# Patient Record
Sex: Female | Born: 1950 | Race: White | Hispanic: No | Smoking: Former smoker
Health system: Southern US, Community
[De-identification: ages and names within clinical notes are randomized; demographics above are authoritative.]

## PROBLEM LIST (undated history)

## (undated) DIAGNOSIS — Z9889 Other specified postprocedural states: Secondary | ICD-10-CM

## (undated) DIAGNOSIS — F32A Depression, unspecified: Secondary | ICD-10-CM

## (undated) DIAGNOSIS — K219 Gastro-esophageal reflux disease without esophagitis: Secondary | ICD-10-CM

## (undated) DIAGNOSIS — G8929 Other chronic pain: Secondary | ICD-10-CM

## (undated) DIAGNOSIS — F329 Major depressive disorder, single episode, unspecified: Secondary | ICD-10-CM

## (undated) DIAGNOSIS — M549 Dorsalgia, unspecified: Secondary | ICD-10-CM

## (undated) DIAGNOSIS — F419 Anxiety disorder, unspecified: Secondary | ICD-10-CM

## (undated) DIAGNOSIS — R112 Nausea with vomiting, unspecified: Secondary | ICD-10-CM

## (undated) DIAGNOSIS — C801 Malignant (primary) neoplasm, unspecified: Secondary | ICD-10-CM

## (undated) DIAGNOSIS — E7800 Pure hypercholesterolemia, unspecified: Secondary | ICD-10-CM

## (undated) DIAGNOSIS — E039 Hypothyroidism, unspecified: Secondary | ICD-10-CM

## (undated) DIAGNOSIS — M1990 Unspecified osteoarthritis, unspecified site: Secondary | ICD-10-CM

## (undated) HISTORY — PX: CHOLECYSTECTOMY: SHX55

## (undated) HISTORY — PX: ABDOMINAL HYSTERECTOMY: SHX81

## (undated) HISTORY — DX: Major depressive disorder, single episode, unspecified: F32.9

## (undated) HISTORY — DX: Dorsalgia, unspecified: M54.9

## (undated) HISTORY — DX: Other chronic pain: G89.29

## (undated) HISTORY — DX: Depression, unspecified: F32.A

## (undated) HISTORY — DX: Hypothyroidism, unspecified: E03.9

## (undated) HISTORY — DX: Pure hypercholesterolemia, unspecified: E78.00

## (undated) HISTORY — DX: Gastro-esophageal reflux disease without esophagitis: K21.9

---

## 2016-05-25 HISTORY — PX: LAPAROSCOPIC CHOLECYSTECTOMY: SUR755

## 2016-08-07 ENCOUNTER — Encounter: Payer: Self-pay | Admitting: Cardiology

## 2016-08-07 ENCOUNTER — Ambulatory Visit (INDEPENDENT_AMBULATORY_CARE_PROVIDER_SITE_OTHER): Payer: Medicare Other | Admitting: Cardiology

## 2016-08-07 VITALS — BP 123/70 | HR 74 | Ht 65.5 in | Wt 175.4 lb

## 2016-08-07 DIAGNOSIS — E782 Mixed hyperlipidemia: Secondary | ICD-10-CM | POA: Diagnosis not present

## 2016-08-07 DIAGNOSIS — F17201 Nicotine dependence, unspecified, in remission: Secondary | ICD-10-CM | POA: Diagnosis not present

## 2016-08-07 DIAGNOSIS — R0602 Shortness of breath: Secondary | ICD-10-CM | POA: Diagnosis not present

## 2016-08-07 DIAGNOSIS — K219 Gastro-esophageal reflux disease without esophagitis: Secondary | ICD-10-CM | POA: Diagnosis not present

## 2016-08-07 DIAGNOSIS — I2510 Atherosclerotic heart disease of native coronary artery without angina pectoris: Secondary | ICD-10-CM

## 2016-08-07 NOTE — Patient Instructions (Signed)
Medication Instructions:  Your physician recommends that you continue on your current medications as directed. Please refer to the Current Medication list given to you today.  Labwork: NONE  Testing/Procedures: Your physician has requested that you have en exercise stress myoview. For further information please visit [email or web address]. Please follow instruction sheet, as given.  Follow-Up: Your physician recommends that you schedule a follow-up appointment PENDING TEST RESULTS  Any Other Special Instructions Will Be Listed Below (If Applicable).  If you need a refill on your cardiac medications before your next appointment, please call your pharmacy. 

## 2016-08-07 NOTE — Progress Notes (Signed)
Cardiology Office Note  Date: 08/07/2016   ID: [name], DOB 04/17/1951, MRN [id number]  PCP: Glenda Chroman, MD  Consulting Cardiologist: Rozann Lesches, MD   Chief Complaint  Patient presents with  . Shortness of Breath    History of Present Illness: Katherine Pope is a 66 y.o. female referred for cardiology consultation by Dr. Woody Seller for evaluation of shortness of breath. I reviewed her records. She was hospitalized at [facility] in February with acute cholecystitis. She underwent laparoscopic cholecystectomy at that time, was incidentally noted to have bilateral pleural effusions by chest imaging, right greater than left. These were described as relatively small. CT imaging incidentally noted multivessel coronary artery calcifications. She recovered from surgery and was also treated with a limited course of Lasix, reportedly with improvement in her pleural effusions by subsequent chest x-ray per Dr. Woody Seller. She also had an echocardiogram done at [facility] as outlined below showing normal LVEF.  She presents today stating that she is back to baseline, does not feel any particular shortness of breath with activity, no chest pain or palpitations. She took Lasix for a total of about 2 weeks. She is no longer on diuretic therapy. I did talk with her about the CT findings indicating coronary artery calcifications. She has not undergone any prior ischemic testing. Family history includes premature heart disease in her mother. She has a remote history of tobacco use, also hyperlipidemia. I reviewed her ECG from February.  Past Medical History:  Diagnosis Date  . Chronic back pain   . Depression   . Hypercholesteremia   . Hypothyroidism     Past Surgical History:  Procedure Laterality Date  . ABDOMINAL HYSTERECTOMY    . LAPAROSCOPIC CHOLECYSTECTOMY  05/2016    Current Outpatient Prescriptions  Medication Sig Dispense Refill  . levothyroxine (SYNTHROID,  LEVOTHROID) 75 MCG tablet Take 75 mcg by mouth daily before breakfast.    . Omega-3 Fatty Acids (FISH OIL) 1000 MG CAPS Take by mouth.    . pantoprazole (PROTONIX) 40 MG tablet Take 40 mg by mouth daily.     . Red Yeast Rice 600 MG CAPS Take by mouth.    . sertraline (ZOLOFT) 100 MG tablet Take 100 mg by mouth daily.     No current facility-administered medications for this visit.    Allergies:  Morphine and related   Social History: The patient  reports that she quit smoking about 20 years ago. Her smoking use included Cigarettes. She has never used smokeless tobacco. She reports that she does not drink alcohol or use drugs.   Family History: The patient's family history includes COPD in her mother; Lung cancer in her father.   ROS:  Please see the history of present illness. Otherwise, complete review of systems is positive for reflux symptoms, improved on Protonix.  All other systems are reviewed and negative.   Physical Exam: VS:  BP 123/70 (BP Location: Right Arm, Cuff Size: Normal)   Pulse 74   Ht 5' 5.5" (1.664 m)   Wt 175 lb 6.4 oz (79.6 kg)   SpO2 93%   BMI 28.74 kg/m , BMI Body mass index is 28.74 kg/m.  Wt Readings from Last 3 Encounters:  08/07/16 175 lb 6.4 oz (79.6 kg)    General: Overweight woman, appears comfortable at rest. HEENT: Conjunctiva and lids normal, oropharynx clear. Neck: Supple, no elevated JVP or carotid bruits, no thyromegaly. Lungs: Clear to auscultation, nonlabored breathing at rest.  Cardiac: Regular rate and rhythm, no S3 or significant systolic murmur, no pericardial rub. Abdomen: Soft, nontender, bowel sounds present, no guarding or rebound. Extremities: No pitting edema, distal pulses 2+. Skin: Warm and dry. Musculoskeletal: No kyphosis. Neuropsychiatric: Alert and oriented x3, affect grossly appropriate.  ECG: I personally reviewed the tracing from 06/15/2016 which showed normal sinus rhythm with low voltage.  Recent Labwork:  February  2018: BUN 13, creatinine 0.51, potassium 3.6, hemoglobin 10.9, platelets 2:15, troponin T less than 0.013  Other Studies Reviewed Today:  Echocardiogram 07/19/2016 [facility]): Normal LV wall thickness with LVEF 60-65%, on graded diastolic dysfunction, normal left atrial chamber size, mild mitral regurgitation, mild tricuspid regurgitation, estimated RVSP 20-30 mmHg, trace posterior pericardial effusion.  Assessment and Plan:  1. History of shortness of breath, presently resolved. She did have small pleural effusions at the time of acute cholecystitis, most likely inflammatory as her subsequent echocardiogram showed normal LVEF.  2. Incidentally noted multivessel coronary artery calcifications by chest CT imaging in February at time of cholecystitis. She does not report any frank angina symptoms. ECG reviewed and nonspecific with low voltage. Plan is to obtain a exercise Myoview for formal ischemic evaluation. If low risk would anticipate risk factor modification strategies.  3. History of hyperlipidemia, currently on red yeast rice and omega-3 supplements.  4. Tobacco abuse in remission.  5. Reflux, much improved since initiating Protonix.  Current medicines were reviewed with the patient today.  Disposition: Call with test results.  Signed, Satira Sark, MD, [facility] 08/07/2016 1:40 PM    [facility] [place], [place], [place] [address] Phone: [phone]; Fax: [phone]

## 2016-08-10 ENCOUNTER — Other Ambulatory Visit (HOSPITAL_COMMUNITY): Payer: Medicare Other

## 2016-08-10 ENCOUNTER — Encounter (HOSPITAL_COMMUNITY): Payer: Medicare Other

## 2016-08-16 ENCOUNTER — Encounter (HOSPITAL_COMMUNITY)
Admission: RE | Admit: 2016-08-16 | Discharge: 2016-08-16 | Disposition: A | Discharge status: Home / Self Care | Payer: Medicare Other | Source: Ambulatory Visit | Attending: Cardiology | Admitting: Cardiology

## 2016-08-16 ENCOUNTER — Encounter (HOSPITAL_COMMUNITY): Payer: Self-pay

## 2016-08-16 ENCOUNTER — Ambulatory Visit (HOSPITAL_COMMUNITY)
Admission: RE | Admit: 2016-08-16 | Discharge: 2016-08-16 | Disposition: A | Discharge status: Home / Self Care | Payer: Medicare Other | Source: Ambulatory Visit | Attending: Cardiology | Admitting: Cardiology

## 2016-08-16 DIAGNOSIS — I2510 Atherosclerotic heart disease of native coronary artery without angina pectoris: Secondary | ICD-10-CM | POA: Diagnosis not present

## 2016-08-16 LAB — NM MYOCAR MULTI W/SPECT W/WALL MOTION / EF
CHL CUP MPHR: 153 {beats}/min
CHL CUP NUCLEAR SDS: 8
CHL CUP RESTING HR STRESS: 64 {beats}/min
CSEPEDS: 14 s
CSEPHR: 82 %
CSEPPHR: 126 {beats}/min
Estimated workload: 7 METS
Exercise duration (min): 4 min
LHR: 0.42
LVDIAVOL: 53 mL (ref 46–106)
LVSYSVOL: 20 mL
RPE: 13
SRS: 1
SSS: 9
TID: 1.15

## 2016-08-16 MED ORDER — REGADENOSON 0.4 MG/5ML IV SOLN
INTRAVENOUS | Status: AC
  Administered 2016-08-16: 0.4 mg via INTRAVENOUS
  Filled 2016-08-16: qty 5

## 2016-08-16 MED ORDER — SODIUM CHLORIDE 0.9% FLUSH
INTRAVENOUS | Status: AC
  Administered 2016-08-16: 10 mL via INTRAVENOUS
  Filled 2016-08-16: qty 10

## 2016-08-16 MED ORDER — TECHNETIUM TC 99M TETROFOSMIN IV KIT
10.0000 | PACK | Freq: Once | INTRAVENOUS | Status: AC | PRN
  Administered 2016-08-16: 10 via INTRAVENOUS

## 2016-08-16 MED ORDER — TECHNETIUM TC 99M TETROFOSMIN IV KIT
30.0000 | PACK | Freq: Once | INTRAVENOUS | Status: AC | PRN
  Administered 2016-08-16: 30 via INTRAVENOUS

## 2016-08-17 ENCOUNTER — Telehealth: Payer: Self-pay

## 2016-08-17 NOTE — Telephone Encounter (Signed)
-----   Message from Satira Sark, MD sent at 08/17/2016  8:20 AM EDT ----- Results reviewed. Please let her know that stress testing was reassuring, low risk without evidence of obstructive CAD. Recommend that she continue to follow with Dr. Woody Seller for risk factor modification. We can see her back as needed. A copy of this test should be forwarded to Glenda Chroman, MD.

## 2016-08-18 NOTE — Telephone Encounter (Signed)
-----   Message from Satira Sark, MD sent at 08/17/2016  8:20 AM EDT ----- Results reviewed. Please let her know that stress testing was reassuring, low risk without evidence of obstructive CAD. Recommend that she continue to follow with Dr. Woody Seller for risk factor modification. We can see her back as needed. A copy of this test should be forwarded to Glenda Chroman, MD.

## 2016-08-18 NOTE — Telephone Encounter (Signed)
Patient notified. Routed to PCP 

## 2016-12-06 ENCOUNTER — Encounter: Payer: Self-pay | Admitting: Cardiology

## 2016-12-06 ENCOUNTER — Ambulatory Visit (INDEPENDENT_AMBULATORY_CARE_PROVIDER_SITE_OTHER): Payer: Medicare Other | Admitting: Cardiology

## 2016-12-06 VITALS — BP 122/74 | HR 67 | Ht 65.5 in | Wt 172.0 lb

## 2016-12-06 DIAGNOSIS — E782 Mixed hyperlipidemia: Secondary | ICD-10-CM

## 2016-12-06 DIAGNOSIS — I2510 Atherosclerotic heart disease of native coronary artery without angina pectoris: Secondary | ICD-10-CM

## 2016-12-06 NOTE — Patient Instructions (Signed)

## 2016-12-06 NOTE — Progress Notes (Signed)
Cardiology Office Note  Date: 12/06/2016   ID: Katherine Pope, DOB 02-Mar-1951, MRN [id number]  PCP: Glenda Chroman, MD  Primary Cardiologist: Rozann Lesches, MD   Chief Complaint  Patient presents with  . Cardiac follow-up    History of Present Illness: Katherine Pope is a 67 y.o. female that I met in consultation back in April of this year. She presents for a follow-up visit. Interval records indicate hospitalization at [facility] in July with epigastric discomfort. She ruled out for ACS and was started on omeprazole for possibility of GI etiology. She states that she has had no further symptoms since that time, feels like the PPI may be helping.  Exercise Myoview from April of this year was low risk as outlined below. We went over these results today as well. She does have a history of coronary calcifications based on chest CT imaging, but her stress testing would suggest that these are nonobstructive.  I personally reviewed her ECG from 10/31/2016 obtained during hospital observation showing sinus rhythm with low voltage.  Past Medical History:  Diagnosis Date  . Chronic back pain   . Depression   . Hypercholesteremia   . Hypothyroidism     Past Surgical History:  Procedure Laterality Date  . ABDOMINAL HYSTERECTOMY    . LAPAROSCOPIC CHOLECYSTECTOMY  05/2016    Current Outpatient Prescriptions  Medication Sig Dispense Refill  . citalopram (CELEXA) 20 MG tablet Take 20 mg by mouth daily.    Marland Kitchen levothyroxine (SYNTHROID, LEVOTHROID) 75 MCG tablet Take 75 mcg by mouth daily before breakfast.    . Omega-3 Fatty Acids (FISH OIL) 1000 MG CAPS Take by mouth.    . pantoprazole (PROTONIX) 40 MG tablet Take 40 mg by mouth daily.     . Red Yeast Rice 600 MG CAPS Take by mouth.     No current facility-administered medications for this visit.    Allergies:  Morphine and related   Social History: The patient  reports that she quit smoking about 20 years ago. Her smoking  use included Cigarettes. She has never used smokeless tobacco. She reports that she does not drink alcohol or use drugs.   ROS:  Please see the history of present illness. Otherwise, complete review of systems is positive for intermittent indigestion.  All other systems are reviewed and negative.   Physical Exam: VS:  BP 122/74   Pulse 67   Ht 5' 5.5" (1.664 m)   Wt 172 lb (78 kg)   SpO2 98%   BMI 28.19 kg/m , BMI Body mass index is 28.19 kg/m.  Wt Readings from Last 3 Encounters:  12/06/16 172 lb (78 kg)  08/07/16 175 lb 6.4 oz (79.6 kg)    General: Overweight woman, appears comfortable at rest. HEENT: Conjunctiva and lids normal, oropharynx clear. Neck: Supple, no elevated JVP or carotid bruits, no thyromegaly. Lungs: Clear to auscultation, nonlabored breathing at rest. Cardiac: Regular rate and rhythm, no S3 or significant systolic murmur, no pericardial rub. Abdomen: Soft, nontender, bowel sounds present, no guarding or rebound. Extremities: No pitting edema, distal pulses 2+.  ECG: I personally reviewed the tracing from 06/15/2016 which showed normal sinus rhythm with low voltage.  Recent Labwork:  February 2018: BUN 13, creatinine 0.51, potassium 3.6, hemoglobin 10.9, platelets 2:15, troponin T less than 0.013 July 2018: BUN 18, creatinine 0.79, AST 41, ALT 29, potassium 4.5, troponin T less than 0.01, hemoglobin 11.6, platelets 193  Other Studies Reviewed Today:  Echocardiogram 07/19/2016 [facility]): Normal LV wall thickness with LVEF 60-65%, on graded diastolic dysfunction, normal left atrial chamber size, mild mitral regurgitation, mild tricuspid regurgitation, estimated RVSP 20-30 mmHg, trace posterior pericardial effusion.  Exercise Myoview 08/16/2016:  Blood pressure demonstrated a hypertensive response to exercise.  The study is normal. No ischemia or scar.  This is a low risk study.  Nuclear stress EF: 61%.  Chest x-ray 10/31/2016 ([place]): Cardiomegaly with bibasilar atelectasis.  Assessment and Plan:  1. Coronary artery calcifications by chest CT imaging in February. She had an exercise Myoview in April of this year that was negative for ischemia and has normal LVEF as well. Episode of epigastric discomfort in July may well have been gastrointestinal in etiology since she reports improvement in symptoms on Protonix. From the perspective of cardiac status would recommend risk factor modification, low dose aspirin and consideration of statin therapy for treatment of hyperlipidemia. She will keep routine follow-up with Dr. Woody Seller and we will see her back annually.  2. History of hyperlipidemia, presently on omega-3 supplements and red yeast rice. Consider transition to statin for aggressive risk factor modification. She will follow up with Dr. Woody Seller.  Current medicines were reviewed with the patient today.  Disposition: Follow-up in one year.  Signed, Satira Sark, MD, [facility] 12/06/2016 3:41 PM    [facility] [place], [place], [place] [address] Phone: [phone]; Fax: [phone]

## 2018-01-09 ENCOUNTER — Telehealth: Payer: Self-pay | Admitting: Cardiology

## 2018-01-09 NOTE — Telephone Encounter (Signed)
Numerous attempts to contact patient with recall letters. Unable to reach by telephone. with no success.  12/06/2016 3:39 PM New [10]     [System] 08/22/2017 11:02 PM Notification Sent [20]   Katherine Pope [[id number]] 12/05/2017 12:24 PM Notification Sent [20]   Katherine Pope [[id number]] 01/09/2018 12:33 PM Notification Sent

## 2018-03-25 DIAGNOSIS — N390 Urinary tract infection, site not specified: Secondary | ICD-10-CM | POA: Diagnosis not present

## 2018-03-25 DIAGNOSIS — G4700 Insomnia, unspecified: Secondary | ICD-10-CM | POA: Diagnosis not present

## 2018-03-25 DIAGNOSIS — Z299 Encounter for prophylactic measures, unspecified: Secondary | ICD-10-CM | POA: Diagnosis not present

## 2018-03-25 DIAGNOSIS — Z6829 Body mass index (BMI) 29.0-29.9, adult: Secondary | ICD-10-CM | POA: Diagnosis not present

## 2018-03-25 DIAGNOSIS — R109 Unspecified abdominal pain: Secondary | ICD-10-CM | POA: Diagnosis not present

## 2018-03-25 DIAGNOSIS — E039 Hypothyroidism, unspecified: Secondary | ICD-10-CM | POA: Diagnosis not present

## 2018-04-24 DIAGNOSIS — U071 COVID-19: Secondary | ICD-10-CM

## 2018-04-24 HISTORY — DX: COVID-19: U07.1

## 2018-04-30 DIAGNOSIS — E039 Hypothyroidism, unspecified: Secondary | ICD-10-CM | POA: Diagnosis not present

## 2018-04-30 DIAGNOSIS — Z299 Encounter for prophylactic measures, unspecified: Secondary | ICD-10-CM | POA: Diagnosis not present

## 2018-04-30 DIAGNOSIS — Z6830 Body mass index (BMI) 30.0-30.9, adult: Secondary | ICD-10-CM | POA: Diagnosis not present

## 2018-04-30 DIAGNOSIS — J069 Acute upper respiratory infection, unspecified: Secondary | ICD-10-CM | POA: Diagnosis not present

## 2018-04-30 DIAGNOSIS — R509 Fever, unspecified: Secondary | ICD-10-CM | POA: Diagnosis not present

## 2018-05-07 DIAGNOSIS — Z6829 Body mass index (BMI) 29.0-29.9, adult: Secondary | ICD-10-CM | POA: Diagnosis not present

## 2018-05-07 DIAGNOSIS — Z299 Encounter for prophylactic measures, unspecified: Secondary | ICD-10-CM | POA: Diagnosis not present

## 2018-05-07 DIAGNOSIS — Z87891 Personal history of nicotine dependence: Secondary | ICD-10-CM | POA: Diagnosis not present

## 2018-05-07 DIAGNOSIS — L299 Pruritus, unspecified: Secondary | ICD-10-CM | POA: Diagnosis not present

## 2018-05-14 DIAGNOSIS — Z1231 Encounter for screening mammogram for malignant neoplasm of breast: Secondary | ICD-10-CM | POA: Diagnosis not present

## 2018-05-29 DIAGNOSIS — R928 Other abnormal and inconclusive findings on diagnostic imaging of breast: Secondary | ICD-10-CM | POA: Diagnosis not present

## 2018-05-29 DIAGNOSIS — R922 Inconclusive mammogram: Secondary | ICD-10-CM | POA: Diagnosis not present

## 2018-05-29 DIAGNOSIS — N6001 Solitary cyst of right breast: Secondary | ICD-10-CM | POA: Diagnosis not present

## 2018-06-11 DIAGNOSIS — Z299 Encounter for prophylactic measures, unspecified: Secondary | ICD-10-CM | POA: Diagnosis not present

## 2018-06-11 DIAGNOSIS — R1110 Vomiting, unspecified: Secondary | ICD-10-CM | POA: Diagnosis not present

## 2018-06-11 DIAGNOSIS — R1013 Epigastric pain: Secondary | ICD-10-CM | POA: Diagnosis not present

## 2018-06-11 DIAGNOSIS — Z6829 Body mass index (BMI) 29.0-29.9, adult: Secondary | ICD-10-CM | POA: Diagnosis not present

## 2018-06-11 DIAGNOSIS — E039 Hypothyroidism, unspecified: Secondary | ICD-10-CM | POA: Diagnosis not present

## 2018-06-12 DIAGNOSIS — L299 Pruritus, unspecified: Secondary | ICD-10-CM | POA: Diagnosis not present

## 2018-06-12 DIAGNOSIS — D225 Melanocytic nevi of trunk: Secondary | ICD-10-CM | POA: Diagnosis not present

## 2018-06-12 DIAGNOSIS — L859 Epidermal thickening, unspecified: Secondary | ICD-10-CM | POA: Diagnosis not present

## 2018-06-12 DIAGNOSIS — R5383 Other fatigue: Secondary | ICD-10-CM | POA: Diagnosis not present

## 2018-06-12 DIAGNOSIS — R112 Nausea with vomiting, unspecified: Secondary | ICD-10-CM | POA: Diagnosis not present

## 2018-06-12 DIAGNOSIS — R12 Heartburn: Secondary | ICD-10-CM | POA: Diagnosis not present

## 2018-06-12 DIAGNOSIS — R5381 Other malaise: Secondary | ICD-10-CM | POA: Diagnosis not present

## 2018-06-12 DIAGNOSIS — D485 Neoplasm of uncertain behavior of skin: Secondary | ICD-10-CM | POA: Diagnosis not present

## 2018-06-20 DIAGNOSIS — Z2821 Immunization not carried out because of patient refusal: Secondary | ICD-10-CM | POA: Diagnosis not present

## 2018-06-20 DIAGNOSIS — Z299 Encounter for prophylactic measures, unspecified: Secondary | ICD-10-CM | POA: Diagnosis not present

## 2018-06-20 DIAGNOSIS — Z6829 Body mass index (BMI) 29.0-29.9, adult: Secondary | ICD-10-CM | POA: Diagnosis not present

## 2018-06-20 DIAGNOSIS — R945 Abnormal results of liver function studies: Secondary | ICD-10-CM | POA: Diagnosis not present

## 2018-06-20 DIAGNOSIS — E039 Hypothyroidism, unspecified: Secondary | ICD-10-CM | POA: Diagnosis not present

## 2018-06-24 DIAGNOSIS — N200 Calculus of kidney: Secondary | ICD-10-CM | POA: Diagnosis not present

## 2018-06-24 DIAGNOSIS — R945 Abnormal results of liver function studies: Secondary | ICD-10-CM | POA: Diagnosis not present

## 2018-07-01 ENCOUNTER — Encounter: Payer: Self-pay | Admitting: Internal Medicine

## 2018-09-02 ENCOUNTER — Encounter: Payer: Self-pay | Admitting: Nurse Practitioner

## 2018-09-02 ENCOUNTER — Other Ambulatory Visit: Payer: Self-pay

## 2018-09-02 ENCOUNTER — Ambulatory Visit (INDEPENDENT_AMBULATORY_CARE_PROVIDER_SITE_OTHER): Payer: Medicare HMO | Admitting: Nurse Practitioner

## 2018-09-02 DIAGNOSIS — Z0000 Encounter for general adult medical examination without abnormal findings: Secondary | ICD-10-CM | POA: Diagnosis not present

## 2018-09-02 DIAGNOSIS — R109 Unspecified abdominal pain: Secondary | ICD-10-CM

## 2018-09-02 DIAGNOSIS — Z299 Encounter for prophylactic measures, unspecified: Secondary | ICD-10-CM | POA: Diagnosis not present

## 2018-09-02 DIAGNOSIS — Z7189 Other specified counseling: Secondary | ICD-10-CM | POA: Diagnosis not present

## 2018-09-02 DIAGNOSIS — Z1339 Encounter for screening examination for other mental health and behavioral disorders: Secondary | ICD-10-CM | POA: Diagnosis not present

## 2018-09-02 DIAGNOSIS — Z1211 Encounter for screening for malignant neoplasm of colon: Secondary | ICD-10-CM | POA: Diagnosis not present

## 2018-09-02 DIAGNOSIS — Z6830 Body mass index (BMI) 30.0-30.9, adult: Secondary | ICD-10-CM | POA: Diagnosis not present

## 2018-09-02 DIAGNOSIS — Z1331 Encounter for screening for depression: Secondary | ICD-10-CM | POA: Diagnosis not present

## 2018-09-02 NOTE — Progress Notes (Signed)
Visit cancelled by patient

## 2018-09-04 DIAGNOSIS — R109 Unspecified abdominal pain: Secondary | ICD-10-CM | POA: Insufficient documentation

## 2018-10-07 DIAGNOSIS — Z79899 Other long term (current) drug therapy: Secondary | ICD-10-CM | POA: Diagnosis not present

## 2018-10-07 DIAGNOSIS — E039 Hypothyroidism, unspecified: Secondary | ICD-10-CM | POA: Diagnosis not present

## 2018-10-07 DIAGNOSIS — R5383 Other fatigue: Secondary | ICD-10-CM | POA: Diagnosis not present

## 2018-10-07 DIAGNOSIS — E7800 Pure hypercholesterolemia, unspecified: Secondary | ICD-10-CM | POA: Diagnosis not present

## 2018-11-07 ENCOUNTER — Ambulatory Visit: Payer: Medicare HMO | Admitting: Nurse Practitioner

## 2018-12-02 DIAGNOSIS — E039 Hypothyroidism, unspecified: Secondary | ICD-10-CM | POA: Diagnosis not present

## 2018-12-02 DIAGNOSIS — R109 Unspecified abdominal pain: Secondary | ICD-10-CM | POA: Diagnosis not present

## 2018-12-02 DIAGNOSIS — R350 Frequency of micturition: Secondary | ICD-10-CM | POA: Diagnosis not present

## 2018-12-02 DIAGNOSIS — Z299 Encounter for prophylactic measures, unspecified: Secondary | ICD-10-CM | POA: Diagnosis not present

## 2018-12-02 DIAGNOSIS — Z6830 Body mass index (BMI) 30.0-30.9, adult: Secondary | ICD-10-CM | POA: Diagnosis not present

## 2018-12-04 ENCOUNTER — Encounter: Payer: Self-pay | Admitting: Gastroenterology

## 2018-12-04 ENCOUNTER — Ambulatory Visit: Payer: Medicare HMO | Admitting: Gastroenterology

## 2018-12-04 ENCOUNTER — Other Ambulatory Visit: Payer: Self-pay

## 2018-12-04 ENCOUNTER — Telehealth: Payer: Self-pay | Admitting: *Deleted

## 2018-12-04 VITALS — BP 141/80 | HR 67 | Temp 97.1°F | Ht 65.5 in | Wt 178.8 lb

## 2018-12-04 DIAGNOSIS — R7989 Other specified abnormal findings of blood chemistry: Secondary | ICD-10-CM

## 2018-12-04 DIAGNOSIS — R945 Abnormal results of liver function studies: Secondary | ICD-10-CM | POA: Diagnosis not present

## 2018-12-04 DIAGNOSIS — R1010 Upper abdominal pain, unspecified: Secondary | ICD-10-CM | POA: Diagnosis not present

## 2018-12-04 MED ORDER — ONDANSETRON HCL 4 MG PO TABS: 4.0000 mg | ORAL_TABLET | Freq: Three times a day (TID) | ORAL | 1 refills | Status: DC | PRN

## 2018-12-04 NOTE — Telephone Encounter (Signed)
PA was approved via [place]. Auth# [id number] Dates 12/04/2018-01/03/2019.   MRI/MRCP scheduled for 8/14 at 3pm, arrival 2:30pm, npo 4 hrs prior.  Called patient and she is aware of appt details.

## 2018-12-04 NOTE — Progress Notes (Signed)
Primary Care Physician:  Berenice Primas Primary Gastroenterologist:  Dr. Gala Romney   Chief Complaint  Patient presents with  . epigastric pain    HPI:   Katherine Pope is a 68 y.o. female presenting today at the request of Nicanor Bake, FNP-C for epigastric pain beginning around Christmas 2019.   Cholecystectomy a few years ago (2018). Symptoms at that time were RUQ pain, N/V. Started having N/V and recurrent abdominal pain but in epigastric and LUQ around Christmas 2019. Started hurting in LUQ, associated with N/V. Abdominal aching. Chills. Started again last Friday with N/V with LUQ radiating across entire upper abdomen. N/V episodes used to happen 3 times a week and now less frequently.   Abdominal pain constant LUQ. Nothing relieves pain. Bloated with eating. Feels like applies pressure to LUQ. Early satiety. Loss of appetite. Feels nauseated. No weight loss. No dysphagia. +GERD. Taking Protonix each morning 30 minutes before breakfast. Ibuprofen prn when hurting. No rectal bleeding. Denies changes in bowel habits. No constipation or diarrhea.   Started on antibiotic for UTI empirically. Macrobid 12/02/18.   No prior colonoscopy or EGD. Declining colonoscopy.   Past Medical History:  Diagnosis Date  . Chronic back pain   . Depression   . GERD (gastroesophageal reflux disease)   . Hypercholesteremia   . Hypothyroidism     Past Surgical History:  Procedure Laterality Date  . ABDOMINAL HYSTERECTOMY    . LAPAROSCOPIC CHOLECYSTECTOMY  05/2016    Current Outpatient Medications  Medication Sig Dispense Refill  . citalopram (CELEXA) 20 MG tablet Take 20 mg by mouth daily.    Marland Kitchen levothyroxine (SYNTHROID, LEVOTHROID) 75 MCG tablet Take 75 mcg by mouth daily before breakfast.    . pantoprazole (PROTONIX) 40 MG tablet Take 40 mg by mouth daily.     . ondansetron (ZOFRAN) 4 MG tablet Take 1 tablet (4 mg total) by mouth every 8 (eight) hours as needed for nausea or vomiting. 45  tablet 1   No current facility-administered medications for this visit.     Allergies as of 12/04/2018 - Review Complete 12/04/2018  Allergen Reaction Noted  . Morphine and related  08/07/2016    Family History  Problem Relation Age of Onset  . COPD Mother   . Lung cancer Father   . Colon cancer Neg Hx   . Colon polyps Neg Hx     Social History   Socioeconomic History  . Marital status: Divorced    Spouse name: Not on file  . Number of children: Not on file  . Years of education: Not on file  . Highest education level: Not on file  Occupational History  . Not on file  Social Needs  . Financial resource strain: Not on file  . Food insecurity    Worry: Not on file    Inability: Not on file  . Transportation needs    Medical: Not on file    Non-medical: Not on file  Tobacco Use  . Smoking status: Former Smoker    Types: Cigarettes    Quit date: 04/24/1996    Years since quitting: 22.6  . Smokeless tobacco: Never Used  Substance and Sexual Activity  . Alcohol use: No  . Drug use: No  . Sexual activity: Not on file  Lifestyle  . Physical activity    Days per week: Not on file    Minutes per session: Not on file  . Stress: Not on file  Relationships  . Social  connections    Talks on phone: Not on file    Gets together: Not on file    Attends religious service: Not on file    Active member of club or organization: Not on file    Attends meetings of clubs or organizations: Not on file    Relationship status: Not on file  . Intimate partner violence    Fear of current or ex partner: Not on file    Emotionally abused: Not on file    Physically abused: Not on file    Forced sexual activity: Not on file  Other Topics Concern  . Not on file  Social History Narrative  . Not on file    Review of Systems: Gen: see HPI CV: Denies chest pain, heart palpitations, peripheral edema, syncope.  Resp: Denies shortness of breath at rest or with exertion. Denies wheezing  or cough.  GI: see HPI  GU : see HPI MS: Denies joint pain, muscle weakness, cramps, or limitation of movement.  Derm: Denies rash, itching, dry skin Psych: Denies depression, anxiety, memory loss, and confusion Heme: Denies bruising, bleeding, and enlarged lymph nodes.  Physical Exam: BP (!) 141/80   Pulse 67   Temp (!) 97.1 F (36.2 C) (Temporal)   Ht 5' 5.5" (1.664 m)   Wt 178 lb 12.8 oz (81.1 kg)   BMI 29.30 kg/m  General:   Alert and oriented. Pleasant and cooperative. Well-nourished and well-developed.  Head:  Normocephalic and atraumatic. Eyes:  Without icterus, sclera clear and conjunctiva pink.  Ears:  Normal auditory acuity. Nose:  No deformity, discharge,  or lesions. Mouth:  No deformity or lesions, oral mucosa pink.  Lungs:  Clear to auscultation bilaterally. No wheezes, rales, or rhonchi. No distress.  Heart:  S1, S2 present without murmurs appreciated.  Abdomen:  +BS, soft, TTP epigastric and non-distended. No HSM noted. No guarding or rebound. No masses appreciated.  Rectal:  Deferred  Msk:  Symmetrical without gross deformities. Normal posture. Extremities:  Without edema. Neurologic:  Alert and  oriented x4 Psych:  Alert and cooperative. Normal mood and affect.  OUTSIDE LABS 12/02/2018: Amylase 20, lipase 18, BUN 10, creatinine 0.84, Albumin 4.0, Tbili 0.8, Alk Phos 193, AST 62, ALT 193, Hgb 13.7, Hct 41.6, Platelets 224  OUTSIDE Labs Feb 2020: Alk Phos 299, AST 31, ALT 66, positive H.pylori IgG (unclear if ever empirically treated) CBC normal.  OUTSIDE COMPLETE US ABDOMEN March 2020: 17 mm CBD. Two mm right mid pole kidney stone. Normal liver. Portal vein patent. Absent gallbladder, pancreas poorly visualized.

## 2018-12-04 NOTE — Assessment & Plan Note (Signed)
Mixed pattern with elevated transaminases (ALT>AST) and elevated alk phos, normal bilirubin. No evidence for underlying fatty liver disease and denies ETOH. With imaging noting dilated CBD, concern for occult stone, sludge causing intermittent obstruction, doubt malignancy. MRCP ordered. Obtain hepatitis serologies from PCP to have on file.

## 2018-12-04 NOTE — Assessment & Plan Note (Signed)
68 year old female s/p cholecystectomy in 2018 now with recurrent abdominal pain predominantly located in LUQ/epigastric and associated elevated alk phos (as high as 299 in Feb 2020 and currently 193) and transaminases (AST 62, ALT 193), with US abdomen March 2020 noting 1.7 cm CBD, normal liver. Non-toxic appearing and afebrile. Discussed at this time we needed to have better imaging of CBD and pursue MRI/MRCP. I do note that she had a positive H.pylori serology and unclear if she has been treated; however, this would not account for abnormal LFTs. Will start with MRI/MRCP with further recommendations after results are obtained. Continue with PPI daily. I have sent in Zofran for supportive measures.

## 2018-12-04 NOTE — Patient Instructions (Signed)
We have scheduled an MRI to further look at your bile duct.   I have sent in Zofran to take every 8 hours as needed. Monitor for constipation.   Continue Protonix once each morning, 30 minutes before breakfast.  Further recommendations to follow!  It was a pleasure to see you today. I want to create trusting relationships with patients to provide genuine, compassionate, and quality care. I value your feedback. If you receive a survey regarding your visit,  I greatly appreciate you taking time to fill this out.   Annitta Needs, PhD, ANP-BC [facility]

## 2018-12-04 NOTE — Telephone Encounter (Signed)
Clinicals have been faxed.

## 2018-12-04 NOTE — Telephone Encounter (Signed)
[employer] requires PA for MRCP. Submitted via [employer]. Requires clinical review. Clinicals needs to be faxed [phone] tracking # [id number]. Will await AB OV note to fax.

## 2018-12-06 ENCOUNTER — Other Ambulatory Visit: Payer: Self-pay | Admitting: Gastroenterology

## 2018-12-06 ENCOUNTER — Other Ambulatory Visit: Payer: Self-pay

## 2018-12-06 ENCOUNTER — Ambulatory Visit (HOSPITAL_COMMUNITY)
Admission: RE | Admit: 2018-12-06 | Discharge: 2018-12-06 | Disposition: A | Discharge status: Home / Self Care | Payer: Medicare HMO | Source: Ambulatory Visit | Attending: Gastroenterology | Admitting: Gastroenterology

## 2018-12-06 DIAGNOSIS — K7689 Other specified diseases of liver: Secondary | ICD-10-CM | POA: Diagnosis not present

## 2018-12-06 DIAGNOSIS — R945 Abnormal results of liver function studies: Secondary | ICD-10-CM | POA: Diagnosis not present

## 2018-12-06 DIAGNOSIS — R1010 Upper abdominal pain, unspecified: Secondary | ICD-10-CM

## 2018-12-06 DIAGNOSIS — K8050 Calculus of bile duct without cholangitis or cholecystitis without obstruction: Secondary | ICD-10-CM | POA: Diagnosis not present

## 2018-12-06 DIAGNOSIS — R7989 Other specified abnormal findings of blood chemistry: Secondary | ICD-10-CM

## 2018-12-06 LAB — POCT I-STAT CREATININE: Creatinine, Ser: 0.8 mg/dL (ref 0.44–1.00)

## 2018-12-06 MED ORDER — GADOBUTROL 1 MMOL/ML IV SOLN
9.0000 mL | Freq: Once | INTRAVENOUS | Status: AC | PRN
  Administered 2018-12-06: 9 mL via INTRAVENOUS

## 2018-12-09 ENCOUNTER — Telehealth: Payer: Self-pay

## 2018-12-09 NOTE — Telephone Encounter (Signed)
Notes recorded by Hassan Rowan, LPN on 4/78/4128 at 2:08 AM EDT  Tammy, Neil Crouch PA and Dr. Gala Romney wants pt to have ERCP with Dr. Laural Golden.  ------   Notes recorded by Annitta Needs, NP on 12/09/2018 at 9:30 AM EDT  Reviewed MRCP. Appreciate Leslie's assistance while I was out. I have sent a message to Dr. Laural Golden regarding timing of procedure.  ------   Notes recorded by Mahala Menghini, PA-C on 12/06/2018 at 8:31 PM EDT  Covering for AB. Called patient 12/06/18 at 2027. Discussed findings with RMR as well.  Patient with CBD stones. Possible calculus in cystic duct remnant as well. Pancreas normal.  Will need ERCP with NUR next week. Please arrange.  ED precautions ie fever, jaundice, persistent pain, vomiting for which she should go to ED. Per RMR, hold off on antibiotics right now as patient currently without above symptoms.

## 2018-12-09 NOTE — Progress Notes (Signed)
Reviewed MRCP. Appreciate Leslie's assistance while I was out. I have sent a message to Dr. Laural Golden regarding timing of procedure.

## 2018-12-10 ENCOUNTER — Other Ambulatory Visit (INDEPENDENT_AMBULATORY_CARE_PROVIDER_SITE_OTHER): Payer: Self-pay | Admitting: Internal Medicine

## 2018-12-10 DIAGNOSIS — K8050 Calculus of bile duct without cholangitis or cholecystitis without obstruction: Secondary | ICD-10-CM

## 2018-12-11 DIAGNOSIS — K8050 Calculus of bile duct without cholangitis or cholecystitis without obstruction: Secondary | ICD-10-CM | POA: Insufficient documentation

## 2018-12-12 ENCOUNTER — Encounter (HOSPITAL_COMMUNITY)
Admission: RE | Admit: 2018-12-12 | Discharge: 2018-12-12 | Disposition: A | Discharge status: Home / Self Care | Payer: Medicare HMO | Source: Ambulatory Visit | Attending: Internal Medicine | Admitting: Internal Medicine

## 2018-12-12 ENCOUNTER — Encounter (HOSPITAL_COMMUNITY): Payer: Self-pay

## 2018-12-12 ENCOUNTER — Other Ambulatory Visit: Payer: Self-pay

## 2018-12-12 ENCOUNTER — Other Ambulatory Visit (HOSPITAL_COMMUNITY)
Admission: RE | Admit: 2018-12-12 | Discharge: 2018-12-12 | Disposition: A | Discharge status: Home / Self Care | Payer: Medicare HMO | Source: Ambulatory Visit | Attending: Internal Medicine | Admitting: Internal Medicine

## 2018-12-12 DIAGNOSIS — K8050 Calculus of bile duct without cholangitis or cholecystitis without obstruction: Secondary | ICD-10-CM | POA: Diagnosis not present

## 2018-12-12 DIAGNOSIS — Z20828 Contact with and (suspected) exposure to other viral communicable diseases: Secondary | ICD-10-CM | POA: Diagnosis not present

## 2018-12-12 DIAGNOSIS — Z01812 Encounter for preprocedural laboratory examination: Secondary | ICD-10-CM | POA: Insufficient documentation

## 2018-12-12 HISTORY — DX: Unspecified osteoarthritis, unspecified site: M19.90

## 2018-12-12 LAB — CBC WITH DIFFERENTIAL/PLATELET
Abs Immature Granulocytes: 0.04 10*3/uL (ref 0.00–0.07)
Basophils Absolute: 0.1 10*3/uL (ref 0.0–0.1)
Basophils Relative: 1 %
Eosinophils Absolute: 0.1 10*3/uL (ref 0.0–0.5)
Eosinophils Relative: 1 %
HCT: 45.9 % (ref 36.0–46.0)
Hemoglobin: 14.4 g/dL (ref 12.0–15.0)
Immature Granulocytes: 1 %
Lymphocytes Relative: 24 %
Lymphs Abs: 1.7 10*3/uL (ref 0.7–4.0)
MCH: 29.4 pg (ref 26.0–34.0)
MCHC: 31.4 g/dL (ref 30.0–36.0)
MCV: 93.7 fL (ref 80.0–100.0)
Monocytes Absolute: 0.3 10*3/uL (ref 0.1–1.0)
Monocytes Relative: 5 %
Neutro Abs: 4.9 10*3/uL (ref 1.7–7.7)
Neutrophils Relative %: 68 %
Platelets: 295 10*3/uL (ref 150–400)
RBC: 4.9 MIL/uL (ref 3.87–5.11)
RDW: 13.7 % (ref 11.5–15.5)
WBC: 7.2 10*3/uL (ref 4.0–10.5)
nRBC: 0 % (ref 0.0–0.2)

## 2018-12-12 LAB — COMPREHENSIVE METABOLIC PANEL
ALT: 87 U/L — ABNORMAL HIGH (ref 0–44)
AST: 27 U/L (ref 15–41)
Albumin: 4.3 g/dL (ref 3.5–5.0)
Alkaline Phosphatase: 217 U/L — ABNORMAL HIGH (ref 38–126)
Anion gap: 8 (ref 5–15)
BUN: 20 mg/dL (ref 8–23)
CO2: 21 mmol/L — ABNORMAL LOW (ref 22–32)
Calcium: 9.1 mg/dL (ref 8.9–10.3)
Chloride: 105 mmol/L (ref 98–111)
Creatinine, Ser: 0.78 mg/dL (ref 0.44–1.00)
GFR calc Af Amer: >60 mL/min (ref >60)
GFR calc non Af Amer: >60 mL/min (ref >60)
Glucose, Bld: 109 mg/dL — ABNORMAL HIGH (ref 70–99)
Potassium: 4.4 mmol/L (ref 3.5–5.1)
Sodium: 134 mmol/L — ABNORMAL LOW (ref 135–145)
Total Bilirubin: 0.6 mg/dL (ref 0.3–1.2)
Total Protein: 7.7 g/dL (ref 6.5–8.1)

## 2018-12-12 LAB — PROTIME-INR
INR: 0.9 (ref 0.8–1.2)
Prothrombin Time: 11.7 seconds (ref 11.4–15.2)

## 2018-12-12 LAB — SARS CORONAVIRUS 2 (TAT 6-24 HRS): SARS Coronavirus 2: NEGATIVE

## 2018-12-12 NOTE — Patient Instructions (Signed)
Katherine Pope  12/12/2018     @PREFPERIOPPHARMACY @   Your procedure is scheduled on  12/16/2018.  Report to [facility] at  0900  A.M.  Call this number if you have problems the morning of surgery:  [phone]   Remember:  Do not eat or drink after midnight.                         Take these medicines the morning of surgery with A SIP OF WATER  Citalopram, levothyroxine, zofran(if needed), protonix.   Do not wear jewelry, make-up or nail polish.  Do not wear lotions, powders, or perfumes, or deodorant.  Do not shave 48 hours prior to surgery.  Men may shave face and neck.  Do not bring valuables to the hospital.  [facility] is not responsible for any belongings or valuables.  Contacts, dentures or bridgework may not be worn into surgery.  Leave your suitcase in the car.  After surgery it may be brought to your room.  For patients admitted to the hospital, discharge time will be determined by your treatment team.  Patients discharged the day of surgery will not be allowed to drive home.   Name and phone number of your driver:   family Special instructions:  None  Please read over the following fact sheets that you were given. Anesthesia Post-op Instructions and Care and Recovery After Surgery       Endoscopic Retrograde Cholangiopancreatogram, Care After This sheet gives you information about how to care for yourself after your procedure. Your health care provider may also give you more specific instructions. If you have problems or questions, contact your health care provider. What can I expect after the procedure? After the procedure, it is common to have:  Soreness in your throat.  Nausea.  Bloating.  Dizziness.  Tiredness (fatigue). Follow these instructions at home:   Take over-the-counter and prescription medicines only as told by your health care provider.  Do not drive for 24 hours if you were given a medicine to help you relax (sedative)  during your procedure. Have someone stay with you for 24 hours after the procedure.  Return to your normal activities as told by your health care provider. Ask your health care provider what activities are safe for you.  Return to eating what you normally do as soon as you feel well enough or as told by your health care provider.  Keep all follow-up visits as told by your health care provider. This is important. Contact a health care provider if:  You have pain in your abdomen that does not get better with medicine.  You develop signs of infection, such as: ? Chills. ? Feeling unwell. Get help right away if:  You have difficulty swallowing.  You have worsening pain in your throat, chest, or abdomen.  You vomit bright red blood or a substance that looks like coffee grounds.  You have bloody or very black stools.  You have a fever.  You have a sudden increase in swelling (bloating) in your abdomen. Summary  After the procedure, it is common to feel tired and to have some discomfort in your throat.  Contact your health care provider if you have signs of infection-such as chills or feeling unwell-or if you have pain that does not improve with medicine.  Get help right away if you have trouble swallowing, worsening pain, bloody or black vomit, bloody or  black stools, a fever, or increased swelling in your abdomen.  Keep all follow-up visits as told by your health care provider. This is important. This information is not intended to replace advice given to you by your health care provider. Make sure you discuss any questions you have with your health care provider. Document Released: 01/29/2013 Document Revised: 03/23/2017 Document Reviewed: 02/28/2016 Elsevier Patient Education  2020 [employer] Anesthesia, Adult, Care After This sheet gives you information about how to care for yourself after your procedure. Your health care provider may also give you more specific  instructions. If you have problems or questions, contact your health care provider. What can I expect after the procedure? After the procedure, the following side effects are common:  Pain or discomfort at the IV site.  Nausea.  Vomiting.  Sore throat.  Trouble concentrating.  Feeling cold or chills.  Weak or tired.  Sleepiness and fatigue.  Soreness and body aches. These side effects can affect parts of the body that were not involved in surgery. Follow these instructions at home:  For at least 24 hours after the procedure:  Have a responsible adult stay with you. It is important to have someone help care for you until you are awake and alert.  Rest as needed.  Do not: ? Participate in activities in which you could fall or become injured. ? Drive. ? Use heavy machinery. ? Drink alcohol. ? Take sleeping pills or medicines that cause drowsiness. ? Make important decisions or sign legal documents. ? Take care of children on your own. Eating and drinking  Follow any instructions from your health care provider about eating or drinking restrictions.  When you feel hungry, start by eating small amounts of foods that are soft and easy to digest (bland), such as toast. Gradually return to your regular diet.  Drink enough fluid to keep your urine pale yellow.  If you vomit, rehydrate by drinking water, juice, or clear broth. General instructions  If you have sleep apnea, surgery and certain medicines can increase your risk for breathing problems. Follow instructions from your health care provider about wearing your sleep device: ? Anytime you are sleeping, including during daytime naps. ? While taking prescription pain medicines, sleeping medicines, or medicines that make you drowsy.  Return to your normal activities as told by your health care provider. Ask your health care provider what activities are safe for you.  Take over-the-counter and prescription medicines only  as told by your health care provider.  If you smoke, do not smoke without supervision.  Keep all follow-up visits as told by your health care provider. This is important. Contact a health care provider if:  You have nausea or vomiting that does not get better with medicine.  You cannot eat or drink without vomiting.  You have pain that does not get better with medicine.  You are unable to pass urine.  You develop a skin rash.  You have a fever.  You have redness around your IV site that gets worse. Get help right away if:  You have difficulty breathing.  You have chest pain.  You have blood in your urine or stool, or you vomit blood. Summary  After the procedure, it is common to have a sore throat or nausea. It is also common to feel tired.  Have a responsible adult stay with you for the first 24 hours after general anesthesia. It is important to have someone help care for you until you  are awake and alert.  When you feel hungry, start by eating small amounts of foods that are soft and easy to digest (bland), such as toast. Gradually return to your regular diet.  Drink enough fluid to keep your urine pale yellow.  Return to your normal activities as told by your health care provider. Ask your health care provider what activities are safe for you. This information is not intended to replace advice given to you by your health care provider. Make sure you discuss any questions you have with your health care provider. Document Released: 07/17/2000 Document Revised: 04/13/2017 Document Reviewed: 11/24/2016 Elsevier Patient Education  2020 [employer].

## 2018-12-16 ENCOUNTER — Telehealth: Payer: Self-pay | Admitting: Gastroenterology

## 2018-12-16 ENCOUNTER — Encounter: Payer: Self-pay | Admitting: Internal Medicine

## 2018-12-16 ENCOUNTER — Ambulatory Visit (HOSPITAL_COMMUNITY)
Admission: RE | Admit: 2018-12-16 | Discharge: 2018-12-16 | Disposition: A | Discharge status: Home / Self Care | Payer: Medicare HMO | Attending: Internal Medicine | Admitting: Internal Medicine

## 2018-12-16 ENCOUNTER — Ambulatory Visit (HOSPITAL_COMMUNITY): Payer: Medicare HMO

## 2018-12-16 ENCOUNTER — Ambulatory Visit (HOSPITAL_COMMUNITY): Payer: Medicare HMO | Admitting: Anesthesiology

## 2018-12-16 ENCOUNTER — Encounter (HOSPITAL_COMMUNITY)
Admission: RE | Disposition: A | Discharge status: Home / Self Care | Payer: Self-pay | Source: Home / Self Care | Attending: Internal Medicine

## 2018-12-16 ENCOUNTER — Encounter (HOSPITAL_COMMUNITY): Payer: Self-pay | Admitting: *Deleted

## 2018-12-16 ENCOUNTER — Ambulatory Visit: Payer: Medicare HMO | Admitting: Nurse Practitioner

## 2018-12-16 DIAGNOSIS — K219 Gastro-esophageal reflux disease without esophagitis: Secondary | ICD-10-CM | POA: Insufficient documentation

## 2018-12-16 DIAGNOSIS — K8051 Calculus of bile duct without cholangitis or cholecystitis with obstruction: Secondary | ICD-10-CM | POA: Diagnosis not present

## 2018-12-16 DIAGNOSIS — K8050 Calculus of bile duct without cholangitis or cholecystitis without obstruction: Secondary | ICD-10-CM | POA: Diagnosis not present

## 2018-12-16 DIAGNOSIS — Z87891 Personal history of nicotine dependence: Secondary | ICD-10-CM | POA: Diagnosis not present

## 2018-12-16 DIAGNOSIS — F329 Major depressive disorder, single episode, unspecified: Secondary | ICD-10-CM | POA: Diagnosis not present

## 2018-12-16 DIAGNOSIS — K5710 Diverticulosis of small intestine without perforation or abscess without bleeding: Secondary | ICD-10-CM | POA: Diagnosis not present

## 2018-12-16 DIAGNOSIS — Z79899 Other long term (current) drug therapy: Secondary | ICD-10-CM | POA: Diagnosis not present

## 2018-12-16 DIAGNOSIS — Z79890 Hormone replacement therapy (postmenopausal): Secondary | ICD-10-CM | POA: Diagnosis not present

## 2018-12-16 DIAGNOSIS — E039 Hypothyroidism, unspecified: Secondary | ICD-10-CM | POA: Diagnosis not present

## 2018-12-16 HISTORY — PX: SPHINCTEROTOMY: SHX5544

## 2018-12-16 HISTORY — PX: ERCP: SHX5425

## 2018-12-16 HISTORY — PX: LITHOTRIPSY: SHX5546

## 2018-12-16 HISTORY — PX: REMOVAL OF STONES: SHX5545

## 2018-12-16 SURGERY — ERCP, WITH INTERVENTION IF INDICATED
Anesthesia: General

## 2018-12-16 MED ORDER — ONDANSETRON HCL 4 MG/2ML IJ SOLN
INTRAMUSCULAR | Status: DC | PRN
  Administered 2018-12-16: 4 mg via INTRAVENOUS

## 2018-12-16 MED ORDER — FENTANYL CITRATE (PF) 100 MCG/2ML IJ SOLN
INTRAMUSCULAR | Status: DC | PRN
  Administered 2018-12-16: 100 ug via INTRAVENOUS

## 2018-12-16 MED ORDER — SUCCINYLCHOLINE CHLORIDE 200 MG/10ML IV SOSY
PREFILLED_SYRINGE | INTRAVENOUS | Status: DC | PRN
  Administered 2018-12-16: 100 mg via INTRAVENOUS

## 2018-12-16 MED ORDER — CHLORHEXIDINE GLUCONATE CLOTH 2 % EX PADS: 6.0000 | MEDICATED_PAD | Freq: Once | CUTANEOUS | Status: DC

## 2018-12-16 MED ORDER — MEPERIDINE HCL 50 MG/ML IJ SOLN: 6.2500 mg | INTRAMUSCULAR | Status: DC | PRN

## 2018-12-16 MED ORDER — ONDANSETRON HCL 4 MG/2ML IJ SOLN: 4.0000 mg | Freq: Once | INTRAMUSCULAR | Status: DC | PRN

## 2018-12-16 MED ORDER — LIDOCAINE HCL (CARDIAC) PF 100 MG/5ML IV SOSY
PREFILLED_SYRINGE | INTRAVENOUS | Status: DC | PRN
  Administered 2018-12-16: 100 mg via INTRAVENOUS

## 2018-12-16 MED ORDER — GLUCAGON HCL RDNA (DIAGNOSTIC) 1 MG IJ SOLR
INTRAMUSCULAR | Status: AC
  Filled 2018-12-16: qty 1

## 2018-12-16 MED ORDER — WATER FOR IRRIGATION, STERILE IR SOLN
Status: DC | PRN
  Administered 2018-12-16: 1000 mL

## 2018-12-16 MED ORDER — ROCURONIUM BROMIDE 100 MG/10ML IV SOLN
INTRAVENOUS | Status: DC | PRN
  Administered 2018-12-16: 50 mg via INTRAVENOUS

## 2018-12-16 MED ORDER — FENTANYL CITRATE (PF) 100 MCG/2ML IJ SOLN
25.0000 ug | INTRAMUSCULAR | Status: DC | PRN
  Administered 2018-12-16 (×2): 25 ug via INTRAVENOUS

## 2018-12-16 MED ORDER — SODIUM CHLORIDE 0.9 % IV SOLN
INTRAVENOUS | Status: DC | PRN
  Administered 2018-12-16: 12:00:00 50 mL

## 2018-12-16 MED ORDER — FENTANYL CITRATE (PF) 100 MCG/2ML IJ SOLN
INTRAMUSCULAR | Status: AC
  Filled 2018-12-16: qty 2

## 2018-12-16 MED ORDER — LIDOCAINE 2% (20 MG/ML) 5 ML SYRINGE
INTRAMUSCULAR | Status: AC
  Filled 2018-12-16: qty 5

## 2018-12-16 MED ORDER — CEFAZOLIN SODIUM-DEXTROSE 2-4 GM/100ML-% IV SOLN
INTRAVENOUS | Status: AC
  Filled 2018-12-16: qty 100

## 2018-12-16 MED ORDER — PHENYLEPHRINE HCL (PRESSORS) 10 MG/ML IV SOLN
INTRAVENOUS | Status: DC | PRN
  Administered 2018-12-16 (×3): 100 ug via INTRAVENOUS

## 2018-12-16 MED ORDER — SUGAMMADEX SODIUM 500 MG/5ML IV SOLN
INTRAVENOUS | Status: DC | PRN
  Administered 2018-12-16: 200 mg via INTRAVENOUS

## 2018-12-16 MED ORDER — SEVOFLURANE IN SOLN
RESPIRATORY_TRACT | Status: AC
  Filled 2018-12-16: qty 500

## 2018-12-16 MED ORDER — IPRATROPIUM-ALBUTEROL 0.5-2.5 (3) MG/3ML IN SOLN
RESPIRATORY_TRACT | Status: AC
  Filled 2018-12-16: qty 3

## 2018-12-16 MED ORDER — IPRATROPIUM-ALBUTEROL 0.5-2.5 (3) MG/3ML IN SOLN
3.0000 mL | Freq: Once | RESPIRATORY_TRACT | Status: AC
  Administered 2018-12-16: 3 mL via RESPIRATORY_TRACT

## 2018-12-16 MED ORDER — PROPOFOL 10 MG/ML IV BOLUS
INTRAVENOUS | Status: AC
  Filled 2018-12-16: qty 40

## 2018-12-16 MED ORDER — LACTATED RINGERS IV SOLN
Freq: Once | INTRAVENOUS | Status: AC
  Administered 2018-12-16: 10:00:00 1000 mL via INTRAVENOUS

## 2018-12-16 MED ORDER — LACTATED RINGERS IV SOLN
INTRAVENOUS | Status: DC | PRN
  Administered 2018-12-16: 11:00:00 via INTRAVENOUS

## 2018-12-16 MED ORDER — GLUCAGON HCL RDNA (DIAGNOSTIC) 1 MG IJ SOLR
INTRAMUSCULAR | Status: DC | PRN
  Administered 2018-12-16 (×2): 0.25 mg via INTRAVENOUS

## 2018-12-16 MED ORDER — GLYCOPYRROLATE PF 0.2 MG/ML IJ SOSY
PREFILLED_SYRINGE | INTRAMUSCULAR | Status: AC
  Filled 2018-12-16: qty 1

## 2018-12-16 MED ORDER — PROPOFOL 10 MG/ML IV BOLUS
INTRAVENOUS | Status: DC | PRN
  Administered 2018-12-16: 180 mg via INTRAVENOUS

## 2018-12-16 MED ORDER — SUCCINYLCHOLINE CHLORIDE 200 MG/10ML IV SOSY
PREFILLED_SYRINGE | INTRAVENOUS | Status: AC
  Filled 2018-12-16: qty 10

## 2018-12-16 MED ORDER — CEFAZOLIN SODIUM-DEXTROSE 2-4 GM/100ML-% IV SOLN
2.0000 g | INTRAVENOUS | Status: AC
  Administered 2018-12-16: 2 g via INTRAVENOUS

## 2018-12-16 MED ORDER — SODIUM CHLORIDE 0.9 % IV SOLN
INTRAVENOUS | Status: AC
  Filled 2018-12-16: qty 50

## 2018-12-16 NOTE — Anesthesia Preprocedure Evaluation (Addendum)
Anesthesia Evaluation  Patient identified by MRN, date of birth, ID band Patient awake    Reviewed: Allergy & Precautions, NPO status , Patient's Chart, lab work & pertinent test results, reviewed documented beta blocker date and time   Airway Mallampati: II  TM Distance: >3 FB     Dental  (+) Upper Dentures, Lower Dentures   Pulmonary former smoker,    Pulmonary exam normal breath sounds clear to auscultation       Cardiovascular Exercise Tolerance: Good Normal cardiovascular exam Rhythm:Regular Rate:Normal  Echocardiogram 07/19/2016 [facility]): Normal LV wall thickness with LVEF 60-65%, on graded diastolic dysfunction, normal left atrial chamber size, mild mitral regurgitation, mild tricuspid regurgitation, estimated RVSP 20-30 mmHg, trace posterior pericardial effusion.  Exercise Myoview 08/16/2016:  Blood pressure demonstrated a hypertensive response to exercise.  The study is normal. No ischemia or scar.  This is a low risk study.  Nuclear stress EF: 61%.   Neuro/Psych PSYCHIATRIC DISORDERS Depression    GI/Hepatic GERD  Controlled,High LFTs, CBD stones   Endo/Other  Hypothyroidism   Renal/GU      Musculoskeletal  (+) Arthritis ,   Abdominal   Peds  Hematology   Anesthesia Other Findings   Reproductive/Obstetrics                            Anesthesia Physical Anesthesia Plan  ASA: II  Anesthesia Plan: General   Post-op Pain Management:    Induction: Intravenous  PONV Risk Score and Plan:   Airway Management Planned: Oral ETT  Additional Equipment:   Intra-op Plan:   Post-operative Plan: Extubation in OR  Informed Consent: I have reviewed the patients History and Physical, chart, labs and discussed the procedure including the risks, benefits and alternatives for the proposed anesthesia with the patient or authorized representative who has indicated  his/her understanding and acceptance.     Dental advisory given  Plan Discussed with: CRNA and Surgeon  Anesthesia Plan Comments:        Anesthesia Quick Evaluation

## 2018-12-16 NOTE — H&P (Signed)
Katherine Pope is an 68 y.o. female.   Chief Complaint: Patient is here for ERCP with sphincterotomy and stone extraction. HPI: Patient is 68 year old Caucasian female who had laparoscopic cholecystectomy about 2-1/2 years ago.  Ever since she has had episodic epigastric pain associated nausea and vomiting.  During that evaluation she was found to have H. pylori infection and she has been treated.  Ultrasound revealed dilated bile duct measuring 17 mm.  Patient was referred to [facility] and she was seen by Ms. Roseanne Kaufman, NP.  MRCP was obtained and which revealed dilated CBD with choledocholithiasis. Patient therefore referred for therapeutic ERCP. Patient reports last episode of pain was about 3 weeks ago.  Pain may last a day or 2.  She has noted orange color to her urine when this occurs.  She has never developed jaundice.  She has good appetite and has not lost any weight.  She does not take OTC NSAIDs.  Patient does not smoke cigarettes or drink alcohol.  Past Medical History:  Diagnosis Date  . Arthritis   . Chronic back pain   . Depression   . GERD (gastroesophageal reflux disease)   . Hypercholesteremia   . Hypothyroidism     Past Surgical History:  Procedure Laterality Date  . ABDOMINAL HYSTERECTOMY    . LAPAROSCOPIC CHOLECYSTECTOMY  05/2016    Family History  Problem Relation Age of Onset  . COPD Mother   . Lung cancer Father   . Colon cancer Neg Hx   . Colon polyps Neg Hx    Social History:  reports that she quit smoking about 22 years ago. Her smoking use included cigarettes. She has a 20.00 pack-year smoking history. She has never used smokeless tobacco. She reports that she does not drink alcohol or use drugs.  Allergies:  Allergies  Allergen Reactions  . Morphine And Related     Oxygen level dropped.     Medications Prior to Admission  Medication Sig Dispense Refill  . citalopram (CELEXA) 20 MG tablet Take 20 mg by mouth daily.    Marland Kitchen  levothyroxine (SYNTHROID, LEVOTHROID) 75 MCG tablet Take 75 mcg by mouth daily before breakfast.    . ondansetron (ZOFRAN) 4 MG tablet Take 1 tablet (4 mg total) by mouth every 8 (eight) hours as needed for nausea or vomiting. 45 tablet 1  . pantoprazole (PROTONIX) 40 MG tablet Take 40 mg by mouth daily.       No results found for this or any previous visit (from the past 48 hour(s)). No results found.  ROS  Blood pressure 135/65, pulse 67, temperature 98.2 F (36.8 C), temperature source Oral, resp. rate 20, SpO2 98 %. Physical Exam  Constitutional: She appears well-developed and well-nourished.  HENT:  Mouth/Throat: Oropharynx is clear and moist.  Eyes: Conjunctivae are normal. No scleral icterus.  Neck: No thyromegaly present.  Cardiovascular: Normal rate, regular rhythm and normal heart sounds.  No murmur heard. Respiratory: Effort normal and breath sounds normal.  GI:  Small supraumbilical scar.  Abdomen is soft and nontender with organomegaly or masses.  Musculoskeletal:        General: No edema.  Lymphadenopathy:    She has no cervical adenopathy.  Neurological: She is alert.  Skin: Skin is warm and dry.    MRCP images reviewed.  She has dilated CBD and CHD and fairly long cystic duct remnant.  There is a large rectangle stone in CBD along with a smaller stone above that.  Lab data from 12/12/2018 reviewed.  Normal CBC and INR. ALT is 87 AST 27 and alkaline phosphatase 217.  Assessment/Plan Patient is 68 year old Caucasian female with recurrent biliary colic who luckily has not developed full-blown cholangitis or obstructive jaundice.   She will undergo ERCP with sphincterotomy and stone extraction.  A larger stone will have to be crushed.  Biliary stenting only if stones cannot be removed.  Hildred Laser, MD 12/16/2018, 10:57 AM

## 2018-12-16 NOTE — Anesthesia Postprocedure Evaluation (Signed)
Anesthesia Post Note  Patient: Katherine Pope  Procedure(s) Performed: ENDOSCOPIC RETROGRADE CHOLANGIOPANCREATOGRAPHY (ERCP) (N/A ) SPHINCTEROTOMY (N/A ) REMOVAL OF STONES (N/A )  Patient location during evaluation: PACU Anesthesia Type: General Level of consciousness: awake and alert and patient cooperative Pain management: pain level controlled Vital Signs Assessment: post-procedure vital signs reviewed and stable Respiratory status: spontaneous breathing, patient connected to face mask oxygen, respiratory function stable and nonlabored ventilation Cardiovascular status: stable Postop Assessment: no apparent nausea or vomiting Anesthetic complications: no     Last Vitals:  Vitals:   12/16/18 0947 12/16/18 1200  BP: 135/65 (!) 146/78  Pulse: 67 85  Resp: 20 11  Temp: 36.8 C 36.6 C  SpO2: 98% 98%    Last Pain:  Vitals:   12/16/18 0947  TempSrc: Oral  PainSc: 4                  Easton Sivertson

## 2018-12-16 NOTE — Telephone Encounter (Signed)
Stacey: please have patient return in 4 weeks for follow-up after ERCP.

## 2018-12-16 NOTE — Progress Notes (Signed)
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 Dilated CBD and CHD with large rectangle stone along with smaller stones. Biliary sphincterotomy performed. Largest stone crushed with a Dormia basket. All stones were removed with basket and stone balloon extractor. ED was not cannulated or filled with contrast Patient tolerated procedure well.

## 2018-12-16 NOTE — Anesthesia Procedure Notes (Signed)
Procedure Name: Intubation Date/Time: 12/16/2018 11:15 AM Performed by: Jonna Munro, CRNA Pre-anesthesia Checklist: Patient identified, Emergency Drugs available, Suction available, Patient being monitored and Timeout performed Patient Re-evaluated:Patient Re-evaluated prior to induction Oxygen Delivery Method: Circle system utilized Preoxygenation: Pre-oxygenation with 100% oxygen Induction Type: IV induction, Rapid sequence and Cricoid Pressure applied Laryngoscope Size: Mac and 3 Grade View: Grade I Tube type: Oral Tube size: 7.0 mm Number of attempts: 1 Airway Equipment and Method: Stylet Placement Confirmation: ETT inserted through vocal cords under direct vision,  positive ETCO2 and breath sounds checked- equal and bilateral Secured at: 22 cm Tube secured with: Tape Dental Injury: Teeth and Oropharynx as per pre-operative assessment

## 2018-12-16 NOTE — Progress Notes (Signed)
Mild audible wheezing noted. Pt denies sob. DR Gwenith Spitz aware and assessed pt, pt now receiving duo neb breathing tx. Nad. Family updated and educated on incentive spirometer as well

## 2018-12-16 NOTE — Transfer of Care (Signed)
Immediate Anesthesia Transfer of Care Note  Patient: Katherine Pope  Procedure(s) Performed: ENDOSCOPIC RETROGRADE CHOLANGIOPANCREATOGRAPHY (ERCP) (N/A ) SPHINCTEROTOMY (N/A ) REMOVAL OF STONES (N/A )  Patient Location: PACU  Anesthesia Type:General  Level of Consciousness: awake, alert , oriented and patient cooperative  Airway & Oxygen Therapy: Patient Spontanous Breathing and Patient connected to face mask oxygen  Post-op Assessment: Report given to RN and Post -op Vital signs reviewed and stable  Post vital signs: Reviewed and stable  Last Vitals:  Vitals Value Taken Time  BP    Temp    Pulse 79 12/16/18 1201  Resp 16 12/16/18 1201  SpO2 96 % 12/16/18 1201  Vitals shown include unvalidated device data.  Last Pain:  Vitals:   12/16/18 0947  TempSrc: Oral  PainSc: 4       Patients Stated Pain Goal: 8 ([id number])  Complications: No apparent anesthesia complications

## 2018-12-16 NOTE — Op Note (Signed)
[facility] Patient Name: Katherine Pope Procedure Date: 12/16/2018 9:58 AM MRN: [id number] Date of Birth: December 03, 1950 Attending MD: Hildred Laser , MD CSN: [id number] Age: 68 Admit Type: Outpatient Procedure:                ERCP Indications:              For therapy of bile duct stone(s) Providers:                Hildred Laser, MD, Otis Peak B. Sharon Seller, RN, Randa Spike, Technician Referring MD:             Roseanne Kaufman , NP and Nicanor Bake, NP Medicines:                General Anesthesia Complications:            No immediate complications. Estimated Blood Loss:     Estimated blood loss: none. Procedure:                Pre-Anesthesia Assessment:                           - Prior to the procedure, a History and Physical                            was performed, and patient medications and                            allergies were reviewed. The patient's tolerance of                            previous anesthesia was also reviewed. The risks                            and benefits of the procedure and the sedation                            options and risks were discussed with the patient.                            All questions were answered, and informed consent                            was obtained. Prior Anticoagulants: The patient has                            taken no previous anticoagulant or antiplatelet                            agents. ASA Grade Assessment: II - A patient with                            mild systemic disease. After reviewing the risks  and benefits, the patient was deemed in                            satisfactory condition to undergo the procedure.                           After obtaining informed consent, the scope was                            passed under direct vision. Throughout the                            procedure, the patient's blood pressure, pulse, and                             oxygen saturations were monitored continuously. The                            [id number]) was introduced through the                            mouth, and used to inject contrast into and used to                            inject contrast into the bile duct. The ERCP was                            accomplished without difficulty. The patient                            tolerated the procedure well. Scope In: Scope Out: Findings:      The scout film was normal. The esophagus was successfully intubated       under direct vision. The scope was advanced to a normal major papilla in       the descending duodenum without detailed examination of the pharynx,       larynx and associated structures, and upper GI tract. The upper GI tract       was grossly normal. Major pailla located in the roof of small       diverticulum. The bile duct was deeply cannulated with the Hydratome       sphincterotome. Contrast was injected. I personally interpreted the bile       duct images. There was brisk flow of contrast through the ducts. Image       quality was adequate. Contrast extended to the main bile duct. Contrast       extended to the bifurcation. The common bile duct and common hepatic       duct were diffusely dilated, with a stone causing an obstruction. The       largest diameter was 20 mm. An 8 mm biliary sphincterotomy was made with       a braided Hydratome sphincterotome using ERBE electrocautery. There was       no post-sphincterotomy bleeding. To discover objects, the biliary tree       was swept with a basket starting at the bifurcation. Many stones were  removed. No stones remained. Lithotripsy with a basket-type device was       successful. The biliary tree was swept with a basket starting at the       bifurcation. All stones were removed. Impression:               - The common bile duct and common hepatic duct were                            dilated, with at least two  stones.One stone was                            large rectangular requiring mechanical lithotripsy.                           - A biliary sphincterotomy was performed.                           - Lithotripsy was successful. Complete removal was                            accomplished by biliary sphincterotomy and basket                            extraction.                           - A biliary sphincterotomy was performed. Moderate Sedation:      Per Anesthesia Care Recommendation:           - Avoid aspirin and nonsteroidal anti-inflammatory                            medicines for 3 days.                           - Discharge patient to home (with escort).                           - Clear liquid diet today.                           - Continue present medications.                           - Return to GI clinic in 4 weeks ( [place] with M.s                            Roseanne Kaufman, NP). Procedure Code(s):        --- Professional ---                           [phone], Endoscopic retrograde                            cholangiopancreatography (ERCP); with destruction  of calculi, any method (eg, mechanical,                            electrohydraulic, lithotripsy)                           [id number], Endoscopic retrograde                            cholangiopancreatography (ERCP); with                            sphincterotomy/papillotomy Diagnosis Code(s):        --- Professional ---                           K80.51, Calculus of bile duct without cholangitis                            or cholecystitis with obstruction CPT copyright 2019 American [employer] Association. All rights reserved. The codes documented in this report are preliminary and upon coder review may  be revised to meet current compliance requirements. Hildred Laser, MD Hildred Laser, MD 12/16/2018 12:13:18 PM This report has been signed electronically. Number of Addenda: 0

## 2018-12-16 NOTE — Discharge Instructions (Signed)
No aspirin or NSAIDs for 72 hours. Resume usual medications as before. Clear liquids today and usual diet starting tomorrow morning. No driving for 24 hours. Follow-up with Ms. Roseanne Kaufman, NP in 4 weeks.  You should also have blood work prior to that visit.  Our office will contact you.   Endoscopic Retrograde Cholangiopancreatogram, Care After This sheet gives you information about how to care for yourself after your procedure. Your health care provider may also give you more specific instructions. If you have problems or questions, contact your health care provider. What can I expect after the procedure? After the procedure, it is common to have:  Soreness in your throat.  Nausea.  Bloating.  Dizziness.  Tiredness (fatigue). Follow these instructions at home:   Take over-the-counter and prescription medicines only as told by your health care provider.  Do not drive for 24 hours if you were given a medicine to help you relax (sedative) during your procedure. Have someone stay with you for 24 hours after the procedure.  Return to your normal activities as told by your health care provider. Ask your health care provider what activities are safe for you.  Return to eating what you normally do as soon as you feel well enough or as told by your health care provider.  Keep all follow-up visits as told by your health care provider. This is important. Contact a health care provider if:  You have pain in your abdomen that does not get better with medicine.  You develop signs of infection, such as: ? Chills. ? Feeling unwell. Get help right away if:  You have difficulty swallowing.  You have worsening pain in your throat, chest, or abdomen.  You vomit bright red blood or a substance that looks like coffee grounds.  You have bloody or very black stools.  You have a fever.  You have a sudden increase in swelling (bloating) in your abdomen. Summary  After the procedure, it  is common to feel tired and to have some discomfort in your throat.  Contact your health care provider if you have signs of infection--such as chills or feeling unwell--or if you have pain that does not improve with medicine.  Get help right away if you have trouble swallowing, worsening pain, bloody or black vomit, bloody or black stools, a fever, or increased swelling in your abdomen.  Keep all follow-up visits as told by your health care provider. This is important. This information is not intended to replace advice given to you by your health care provider. Make sure you discuss any questions you have with your health care provider. Document Released: 01/29/2013 Document Revised: 03/23/2017 Document Reviewed: 02/28/2016 Elsevier Patient Education  2020 [place] Anesthesia, Adult, Care After This sheet gives you information about how to care for yourself after your procedure. Your health care provider may also give you more specific instructions. If you have problems or questions, contact your health care provider. What can I expect after the procedure? After the procedure, the following side effects are common:  Pain or discomfort at the IV site.  Nausea.  Vomiting.  Sore throat.  Trouble concentrating.  Feeling cold or chills.  Weak or tired.  Sleepiness and fatigue.  Soreness and body aches. These side effects can affect parts of the body that were not involved in surgery. Follow these instructions at home:  For at least 24 hours after the procedure:  Have a responsible adult stay with you. It is important  to have someone help care for you until you are awake and alert.  Rest as needed.  Do not: ? Participate in activities in which you could fall or become injured. ? Drive. ? Use heavy machinery. ? Drink alcohol. ? Take sleeping pills or medicines that cause drowsiness. ? Make important decisions or sign legal documents. ? Take care of children on  your own. Eating and drinking  Follow any instructions from your health care provider about eating or drinking restrictions.  When you feel hungry, start by eating small amounts of foods that are soft and easy to digest (bland), such as toast. Gradually return to your regular diet.  Drink enough fluid to keep your urine pale yellow.  If you vomit, rehydrate by drinking water, juice, or clear broth. General instructions  If you have sleep apnea, surgery and certain medicines can increase your risk for breathing problems. Follow instructions from your health care provider about wearing your sleep device: ? Anytime you are sleeping, including during daytime naps. ? While taking prescription pain medicines, sleeping medicines, or medicines that make you drowsy.  Return to your normal activities as told by your health care provider. Ask your health care provider what activities are safe for you.  Take over-the-counter and prescription medicines only as told by your health care provider.  If you smoke, do not smoke without supervision.  Keep all follow-up visits as told by your health care provider. This is important. Contact a health care provider if:  You have nausea or vomiting that does not get better with medicine.  You cannot eat or drink without vomiting.  You have pain that does not get better with medicine.  You are unable to pass urine.  You develop a skin rash.  You have a fever.  You have redness around your IV site that gets worse. Get help right away if:  You have difficulty breathing.  You have chest pain.  You have blood in your urine or stool, or you vomit blood. Summary  After the procedure, it is common to have a sore throat or nausea. It is also common to feel tired.  Have a responsible adult stay with you for the first 24 hours after general anesthesia. It is important to have someone help care for you until you are awake and alert.  When you feel  hungry, start by eating small amounts of foods that are soft and easy to digest (bland), such as toast. Gradually return to your regular diet.  Drink enough fluid to keep your urine pale yellow.  Return to your normal activities as told by your health care provider. Ask your health care provider what activities are safe for you. This information is not intended to replace advice given to you by your health care provider. Make sure you discuss any questions you have with your health care provider. Document Released: 07/17/2000 Document Revised: 04/13/2017 Document Reviewed: 11/24/2016 Elsevier Patient Education  2020 [employer].

## 2018-12-16 NOTE — Telephone Encounter (Signed)
PATIENT SCHEDULED AND LETTER SENT  °

## 2018-12-20 ENCOUNTER — Encounter (HOSPITAL_COMMUNITY): Payer: Self-pay | Admitting: Internal Medicine

## 2019-01-23 ENCOUNTER — Ambulatory Visit: Payer: Medicare HMO | Admitting: Gastroenterology

## 2019-02-10 ENCOUNTER — Ambulatory Visit: Payer: Medicare HMO | Admitting: Gastroenterology

## 2019-03-18 DIAGNOSIS — F419 Anxiety disorder, unspecified: Secondary | ICD-10-CM | POA: Diagnosis not present

## 2019-03-18 DIAGNOSIS — E039 Hypothyroidism, unspecified: Secondary | ICD-10-CM | POA: Diagnosis not present

## 2019-03-18 DIAGNOSIS — Z6830 Body mass index (BMI) 30.0-30.9, adult: Secondary | ICD-10-CM | POA: Diagnosis not present

## 2019-03-18 DIAGNOSIS — Z713 Dietary counseling and surveillance: Secondary | ICD-10-CM | POA: Diagnosis not present

## 2019-03-18 DIAGNOSIS — Z299 Encounter for prophylactic measures, unspecified: Secondary | ICD-10-CM | POA: Diagnosis not present

## 2019-06-22 DIAGNOSIS — K219 Gastro-esophageal reflux disease without esophagitis: Secondary | ICD-10-CM | POA: Diagnosis not present

## 2019-06-22 DIAGNOSIS — E039 Hypothyroidism, unspecified: Secondary | ICD-10-CM | POA: Diagnosis not present

## 2019-08-12 DIAGNOSIS — E039 Hypothyroidism, unspecified: Secondary | ICD-10-CM | POA: Diagnosis not present

## 2019-08-12 DIAGNOSIS — Z299 Encounter for prophylactic measures, unspecified: Secondary | ICD-10-CM | POA: Diagnosis not present

## 2019-08-12 DIAGNOSIS — M549 Dorsalgia, unspecified: Secondary | ICD-10-CM | POA: Diagnosis not present

## 2019-08-12 DIAGNOSIS — Z87891 Personal history of nicotine dependence: Secondary | ICD-10-CM | POA: Diagnosis not present

## 2019-08-12 DIAGNOSIS — R350 Frequency of micturition: Secondary | ICD-10-CM | POA: Diagnosis not present

## 2019-08-12 DIAGNOSIS — F329 Major depressive disorder, single episode, unspecified: Secondary | ICD-10-CM | POA: Diagnosis not present

## 2019-08-27 DIAGNOSIS — J069 Acute upper respiratory infection, unspecified: Secondary | ICD-10-CM | POA: Diagnosis not present

## 2019-08-27 DIAGNOSIS — E039 Hypothyroidism, unspecified: Secondary | ICD-10-CM | POA: Diagnosis not present

## 2019-08-27 DIAGNOSIS — Z299 Encounter for prophylactic measures, unspecified: Secondary | ICD-10-CM | POA: Diagnosis not present

## 2019-08-27 DIAGNOSIS — R05 Cough: Secondary | ICD-10-CM | POA: Diagnosis not present

## 2019-09-09 DIAGNOSIS — Z79899 Other long term (current) drug therapy: Secondary | ICD-10-CM | POA: Diagnosis not present

## 2019-09-09 DIAGNOSIS — Z1339 Encounter for screening examination for other mental health and behavioral disorders: Secondary | ICD-10-CM | POA: Diagnosis not present

## 2019-09-09 DIAGNOSIS — E039 Hypothyroidism, unspecified: Secondary | ICD-10-CM | POA: Diagnosis not present

## 2019-09-09 DIAGNOSIS — Z6831 Body mass index (BMI) 31.0-31.9, adult: Secondary | ICD-10-CM | POA: Diagnosis not present

## 2019-09-09 DIAGNOSIS — Z1211 Encounter for screening for malignant neoplasm of colon: Secondary | ICD-10-CM | POA: Diagnosis not present

## 2019-09-09 DIAGNOSIS — Z299 Encounter for prophylactic measures, unspecified: Secondary | ICD-10-CM | POA: Diagnosis not present

## 2019-09-09 DIAGNOSIS — R5383 Other fatigue: Secondary | ICD-10-CM | POA: Diagnosis not present

## 2019-09-09 DIAGNOSIS — E7800 Pure hypercholesterolemia, unspecified: Secondary | ICD-10-CM | POA: Diagnosis not present

## 2019-09-09 DIAGNOSIS — Z7189 Other specified counseling: Secondary | ICD-10-CM | POA: Diagnosis not present

## 2019-09-09 DIAGNOSIS — Z1331 Encounter for screening for depression: Secondary | ICD-10-CM | POA: Diagnosis not present

## 2019-09-09 DIAGNOSIS — Z0000 Encounter for general adult medical examination without abnormal findings: Secondary | ICD-10-CM | POA: Diagnosis not present

## 2019-09-22 DIAGNOSIS — E039 Hypothyroidism, unspecified: Secondary | ICD-10-CM | POA: Diagnosis not present

## 2019-09-22 DIAGNOSIS — M1990 Unspecified osteoarthritis, unspecified site: Secondary | ICD-10-CM | POA: Diagnosis not present

## 2019-10-22 DIAGNOSIS — F329 Major depressive disorder, single episode, unspecified: Secondary | ICD-10-CM | POA: Diagnosis not present

## 2019-10-22 DIAGNOSIS — E039 Hypothyroidism, unspecified: Secondary | ICD-10-CM | POA: Diagnosis not present

## 2019-11-11 DIAGNOSIS — E7800 Pure hypercholesterolemia, unspecified: Secondary | ICD-10-CM | POA: Diagnosis not present

## 2019-11-11 DIAGNOSIS — J320 Chronic maxillary sinusitis: Secondary | ICD-10-CM | POA: Diagnosis not present

## 2019-11-11 DIAGNOSIS — Z87891 Personal history of nicotine dependence: Secondary | ICD-10-CM | POA: Diagnosis not present

## 2019-11-11 DIAGNOSIS — Z299 Encounter for prophylactic measures, unspecified: Secondary | ICD-10-CM | POA: Diagnosis not present

## 2019-11-11 DIAGNOSIS — E039 Hypothyroidism, unspecified: Secondary | ICD-10-CM | POA: Diagnosis not present

## 2019-12-03 ENCOUNTER — Inpatient Hospital Stay (HOSPITAL_COMMUNITY): Payer: Medicare HMO | Admitting: Certified Registered Nurse Anesthetist

## 2019-12-03 ENCOUNTER — Encounter (HOSPITAL_COMMUNITY): Admission: EM | Disposition: A | Discharge status: Skilled Nursing Facility | Payer: Self-pay | Source: Home / Self Care

## 2019-12-03 ENCOUNTER — Emergency Department (HOSPITAL_COMMUNITY): Payer: Medicare HMO

## 2019-12-03 ENCOUNTER — Encounter (HOSPITAL_COMMUNITY): Payer: Self-pay

## 2019-12-03 ENCOUNTER — Inpatient Hospital Stay (HOSPITAL_COMMUNITY): Payer: Medicare HMO

## 2019-12-03 ENCOUNTER — Other Ambulatory Visit: Payer: Self-pay

## 2019-12-03 ENCOUNTER — Inpatient Hospital Stay (HOSPITAL_COMMUNITY)
Admission: EM | Admit: 2019-12-03 | Discharge: 2019-12-12 | DRG: 516 | Disposition: A | Discharge status: Skilled Nursing Facility | Payer: Medicare HMO | Attending: General Surgery | Admitting: General Surgery

## 2019-12-03 DIAGNOSIS — S81002A Unspecified open wound, left knee, initial encounter: Secondary | ICD-10-CM

## 2019-12-03 DIAGNOSIS — S334XXA Traumatic rupture of symphysis pubis, initial encounter: Secondary | ICD-10-CM

## 2019-12-03 DIAGNOSIS — S329XXA Fracture of unspecified parts of lumbosacral spine and pelvis, initial encounter for closed fracture: Secondary | ICD-10-CM

## 2019-12-03 DIAGNOSIS — S3141XA Laceration without foreign body of vagina and vulva, initial encounter: Secondary | ICD-10-CM

## 2019-12-03 DIAGNOSIS — S32810B Multiple fractures of pelvis with stable disruption of pelvic ring, initial encounter for open fracture: Secondary | ICD-10-CM

## 2019-12-03 DIAGNOSIS — Z419 Encounter for procedure for purposes other than remedying health state, unspecified: Secondary | ICD-10-CM

## 2019-12-03 DIAGNOSIS — S81012A Laceration without foreign body, left knee, initial encounter: Secondary | ICD-10-CM

## 2019-12-03 DIAGNOSIS — T1490XA Injury, unspecified, initial encounter: Secondary | ICD-10-CM

## 2019-12-03 DIAGNOSIS — E039 Hypothyroidism, unspecified: Secondary | ICD-10-CM

## 2019-12-03 DIAGNOSIS — S40811A Abrasion of right upper arm, initial encounter: Secondary | ICD-10-CM | POA: Diagnosis not present

## 2019-12-03 DIAGNOSIS — R52 Pain, unspecified: Secondary | ICD-10-CM | POA: Diagnosis not present

## 2019-12-03 DIAGNOSIS — S3141XD Laceration without foreign body of vagina and vulva, subsequent encounter: Secondary | ICD-10-CM | POA: Diagnosis not present

## 2019-12-03 DIAGNOSIS — K219 Gastro-esophageal reflux disease without esophagitis: Secondary | ICD-10-CM | POA: Diagnosis present

## 2019-12-03 DIAGNOSIS — F329 Major depressive disorder, single episode, unspecified: Secondary | ICD-10-CM | POA: Diagnosis not present

## 2019-12-03 DIAGNOSIS — S32811A Multiple fractures of pelvis with unstable disruption of pelvic ring, initial encounter for closed fracture: Secondary | ICD-10-CM | POA: Diagnosis not present

## 2019-12-03 DIAGNOSIS — S3991XA Unspecified injury of abdomen, initial encounter: Secondary | ICD-10-CM | POA: Diagnosis not present

## 2019-12-03 DIAGNOSIS — D62 Acute posthemorrhagic anemia: Secondary | ICD-10-CM | POA: Diagnosis not present

## 2019-12-03 DIAGNOSIS — I959 Hypotension, unspecified: Secondary | ICD-10-CM | POA: Diagnosis not present

## 2019-12-03 DIAGNOSIS — S3993XA Unspecified injury of pelvis, initial encounter: Secondary | ICD-10-CM | POA: Diagnosis not present

## 2019-12-03 DIAGNOSIS — R0902 Hypoxemia: Secondary | ICD-10-CM | POA: Diagnosis not present

## 2019-12-03 DIAGNOSIS — Z20822 Contact with and (suspected) exposure to covid-19: Secondary | ICD-10-CM | POA: Diagnosis present

## 2019-12-03 DIAGNOSIS — S3219XA Other fracture of sacrum, initial encounter for closed fracture: Secondary | ICD-10-CM | POA: Diagnosis not present

## 2019-12-03 DIAGNOSIS — F419 Anxiety disorder, unspecified: Secondary | ICD-10-CM | POA: Diagnosis present

## 2019-12-03 DIAGNOSIS — S32810D Multiple fractures of pelvis with stable disruption of pelvic ring, subsequent encounter for fracture with routine healing: Secondary | ICD-10-CM | POA: Diagnosis not present

## 2019-12-03 DIAGNOSIS — M2550 Pain in unspecified joint: Secondary | ICD-10-CM | POA: Diagnosis not present

## 2019-12-03 DIAGNOSIS — S299XXA Unspecified injury of thorax, initial encounter: Secondary | ICD-10-CM | POA: Diagnosis not present

## 2019-12-03 DIAGNOSIS — Z885 Allergy status to narcotic agent status: Secondary | ICD-10-CM | POA: Diagnosis not present

## 2019-12-03 DIAGNOSIS — S199XXA Unspecified injury of neck, initial encounter: Secondary | ICD-10-CM | POA: Diagnosis not present

## 2019-12-03 DIAGNOSIS — Z7401 Bed confinement status: Secondary | ICD-10-CM | POA: Diagnosis not present

## 2019-12-03 DIAGNOSIS — M533 Sacrococcygeal disorders, not elsewhere classified: Secondary | ICD-10-CM | POA: Diagnosis not present

## 2019-12-03 DIAGNOSIS — R5381 Other malaise: Secondary | ICD-10-CM | POA: Diagnosis not present

## 2019-12-03 DIAGNOSIS — M6259 Muscle wasting and atrophy, not elsewhere classified, multiple sites: Secondary | ICD-10-CM | POA: Diagnosis not present

## 2019-12-03 DIAGNOSIS — S32811B Multiple fractures of pelvis with unstable disruption of pelvic ring, initial encounter for open fracture: Secondary | ICD-10-CM | POA: Diagnosis not present

## 2019-12-03 DIAGNOSIS — D649 Anemia, unspecified: Secondary | ICD-10-CM | POA: Diagnosis not present

## 2019-12-03 DIAGNOSIS — R21 Rash and other nonspecific skin eruption: Secondary | ICD-10-CM | POA: Diagnosis not present

## 2019-12-03 DIAGNOSIS — S79922A Unspecified injury of left thigh, initial encounter: Secondary | ICD-10-CM | POA: Diagnosis not present

## 2019-12-03 DIAGNOSIS — M6281 Muscle weakness (generalized): Secondary | ICD-10-CM | POA: Diagnosis not present

## 2019-12-03 DIAGNOSIS — F339 Major depressive disorder, recurrent, unspecified: Secondary | ICD-10-CM | POA: Diagnosis not present

## 2019-12-03 DIAGNOSIS — S3992XA Unspecified injury of lower back, initial encounter: Secondary | ICD-10-CM | POA: Diagnosis not present

## 2019-12-03 DIAGNOSIS — S0990XA Unspecified injury of head, initial encounter: Secondary | ICD-10-CM | POA: Diagnosis not present

## 2019-12-03 DIAGNOSIS — S3210XA Unspecified fracture of sacrum, initial encounter for closed fracture: Secondary | ICD-10-CM | POA: Diagnosis not present

## 2019-12-03 HISTORY — PX: EXTERNAL FIXATION PELVIS: SHX1551

## 2019-12-03 LAB — URINALYSIS, ROUTINE W REFLEX MICROSCOPIC
Glucose, UA: NEGATIVE mg/dL
Ketones, ur: NEGATIVE mg/dL
Leukocytes,Ua: NEGATIVE
Nitrite: NEGATIVE
Protein, ur: NEGATIVE mg/dL
Specific Gravity, Urine: >1.03 — ABNORMAL HIGH (ref 1.005–1.030)
pH: 5 (ref 5.0–8.0)

## 2019-12-03 LAB — URINALYSIS, MICROSCOPIC (REFLEX)

## 2019-12-03 LAB — HEPATIC FUNCTION PANEL
ALT: 37 U/L (ref 0–44)
AST: 25 U/L (ref 15–41)
Albumin: 3.8 g/dL (ref 3.5–5.0)
Alkaline Phosphatase: 110 U/L (ref 38–126)
Bilirubin, Direct: 0.1 mg/dL (ref 0.0–0.2)
Indirect Bilirubin: 0.5 mg/dL (ref 0.3–0.9)
Total Bilirubin: 0.6 mg/dL (ref 0.3–1.2)
Total Protein: 6.3 g/dL — ABNORMAL LOW (ref 6.5–8.1)

## 2019-12-03 LAB — BASIC METABOLIC PANEL
Anion gap: 12 (ref 5–15)
BUN: 17 mg/dL (ref 8–23)
CO2: 22 mmol/L (ref 22–32)
Calcium: 9.1 mg/dL (ref 8.9–10.3)
Chloride: 104 mmol/L (ref 98–111)
Creatinine, Ser: 0.91 mg/dL (ref 0.44–1.00)
GFR calc Af Amer: >60 mL/min (ref >60)
GFR calc non Af Amer: >60 mL/min (ref >60)
Glucose, Bld: 158 mg/dL — ABNORMAL HIGH (ref 70–99)
Potassium: 4.5 mmol/L (ref 3.5–5.1)
Sodium: 138 mmol/L (ref 135–145)

## 2019-12-03 LAB — LACTIC ACID, PLASMA: Lactic Acid, Venous: 1.7 mmol/L (ref 0.5–1.9)

## 2019-12-03 LAB — TYPE AND SCREEN
ABO/RH(D): A POS
Antibody Screen: NEGATIVE

## 2019-12-03 LAB — CBC
HCT: 43.1 % (ref 36.0–46.0)
Hemoglobin: 13.5 g/dL (ref 12.0–15.0)
MCH: 30.5 pg (ref 26.0–34.0)
MCHC: 31.3 g/dL (ref 30.0–36.0)
MCV: 97.3 fL (ref 80.0–100.0)
Platelets: 293 10*3/uL (ref 150–400)
RBC: 4.43 MIL/uL (ref 3.87–5.11)
RDW: 13.2 % (ref 11.5–15.5)
WBC: 15.2 10*3/uL — ABNORMAL HIGH (ref 4.0–10.5)
nRBC: 0 % (ref 0.0–0.2)

## 2019-12-03 LAB — HIV ANTIBODY (ROUTINE TESTING W REFLEX): HIV Screen 4th Generation wRfx: NONREACTIVE

## 2019-12-03 LAB — PROTIME-INR
INR: 1 (ref 0.8–1.2)
Prothrombin Time: 12.8 seconds (ref 11.4–15.2)

## 2019-12-03 LAB — SARS CORONAVIRUS 2 BY RT PCR (HOSPITAL ORDER, PERFORMED IN ~~LOC~~ HOSPITAL LAB): SARS Coronavirus 2: NEGATIVE

## 2019-12-03 LAB — MRSA PCR SCREENING: MRSA by PCR: NEGATIVE

## 2019-12-03 SURGERY — EXTERNAL FIXATION, PELVIS
Anesthesia: General

## 2019-12-03 MED ORDER — BUPIVACAINE HCL 0.25 % IJ SOLN
INTRAMUSCULAR | Status: DC | PRN
Start: 2019-12-03 — End: 2019-12-03
  Administered 2019-12-03: 30 mL

## 2019-12-03 MED ORDER — VANCOMYCIN HCL 1000 MG IV SOLR
INTRAVENOUS | Status: DC | PRN
  Administered 2019-12-03: 1000 mg via TOPICAL

## 2019-12-03 MED ORDER — SODIUM CHLORIDE 0.9 % IV SOLN
2.0000 g | INTRAVENOUS | Status: AC
  Administered 2019-12-04 – 2019-12-05 (×3): 2 g via INTRAVENOUS
  Filled 2019-12-03 (×3): qty 20

## 2019-12-03 MED ORDER — ACETAMINOPHEN 325 MG PO TABS: 325.0000 mg | ORAL_TABLET | Freq: Once | ORAL | Status: DC | PRN

## 2019-12-03 MED ORDER — LIDOCAINE 2% (20 MG/ML) 5 ML SYRINGE
INTRAMUSCULAR | Status: DC | PRN
  Administered 2019-12-03: 60 mg via INTRAVENOUS

## 2019-12-03 MED ORDER — ENOXAPARIN SODIUM 40 MG/0.4ML ~~LOC~~ SOLN
40.0000 mg | SUBCUTANEOUS | Status: DC
  Administered 2019-12-04 – 2019-12-12 (×9): 40 mg via SUBCUTANEOUS
  Filled 2019-12-03 (×9): qty 0.4

## 2019-12-03 MED ORDER — PANTOPRAZOLE SODIUM 40 MG PO TBEC: 40.0000 mg | DELAYED_RELEASE_TABLET | Freq: Every day | ORAL | Status: DC

## 2019-12-03 MED ORDER — METHOCARBAMOL 1000 MG/10ML IJ SOLN
500.0000 mg | Freq: Four times a day (QID) | INTRAVENOUS | Status: DC | PRN
  Filled 2019-12-03: qty 5

## 2019-12-03 MED ORDER — HYDROMORPHONE HCL 1 MG/ML IJ SOLN
0.5000 mg | Freq: Once | INTRAMUSCULAR | Status: DC
  Filled 2019-12-03: qty 1

## 2019-12-03 MED ORDER — OXYCODONE HCL 5 MG PO TABS
5.0000 mg | ORAL_TABLET | ORAL | Status: DC | PRN
  Administered 2019-12-04: 5 mg via ORAL
  Administered 2019-12-04: 10 mg via ORAL
  Administered 2019-12-05 – 2019-12-07 (×2): 5 mg via ORAL
  Administered 2019-12-08 – 2019-12-10 (×4): 10 mg via ORAL
  Administered 2019-12-10 – 2019-12-12 (×3): 5 mg via ORAL
  Administered 2019-12-12: 10 mg via ORAL
  Filled 2019-12-03: qty 2
  Filled 2019-12-03: qty 1
  Filled 2019-12-03: qty 2
  Filled 2019-12-03: qty 1
  Filled 2019-12-03 (×3): qty 2
  Filled 2019-12-03 (×2): qty 1
  Filled 2019-12-03 (×3): qty 2
  Filled 2019-12-03 (×3): qty 1

## 2019-12-03 MED ORDER — LACTATED RINGERS IV SOLN: INTRAVENOUS | Status: DC

## 2019-12-03 MED ORDER — ACETAMINOPHEN 325 MG PO TABS: 650.0000 mg | ORAL_TABLET | Freq: Four times a day (QID) | ORAL | Status: DC | PRN

## 2019-12-03 MED ORDER — CHLORHEXIDINE GLUCONATE CLOTH 2 % EX PADS
6.0000 | MEDICATED_PAD | Freq: Every day | CUTANEOUS | Status: DC
  Administered 2019-12-03: 6 via TOPICAL

## 2019-12-03 MED ORDER — SUCCINYLCHOLINE CHLORIDE 200 MG/10ML IV SOSY
PREFILLED_SYRINGE | INTRAVENOUS | Status: AC
  Filled 2019-12-03: qty 10

## 2019-12-03 MED ORDER — EPHEDRINE 5 MG/ML INJ
INTRAVENOUS | Status: AC
  Filled 2019-12-03: qty 10

## 2019-12-03 MED ORDER — ONDANSETRON HCL 4 MG/2ML IJ SOLN
4.0000 mg | Freq: Four times a day (QID) | INTRAMUSCULAR | Status: DC | PRN
  Administered 2019-12-03: 4 mg via INTRAVENOUS

## 2019-12-03 MED ORDER — ACETAMINOPHEN 10 MG/ML IV SOLN: 1000.0000 mg | Freq: Once | INTRAVENOUS | Status: DC | PRN

## 2019-12-03 MED ORDER — CHLORHEXIDINE GLUCONATE 4 % EX LIQD: 60.0000 mL | Freq: Once | CUTANEOUS | Status: DC

## 2019-12-03 MED ORDER — LIDOCAINE 2% (20 MG/ML) 5 ML SYRINGE
INTRAMUSCULAR | Status: AC
  Filled 2019-12-03: qty 5

## 2019-12-03 MED ORDER — ORAL CARE MOUTH RINSE: 15.0000 mL | Freq: Once | OROMUCOSAL | Status: DC

## 2019-12-03 MED ORDER — LACTATED RINGERS IV SOLN: INTRAVENOUS | Status: DC | PRN

## 2019-12-03 MED ORDER — ONDANSETRON 4 MG PO TBDP: 4.0000 mg | ORAL_TABLET | Freq: Four times a day (QID) | ORAL | Status: DC | PRN

## 2019-12-03 MED ORDER — SODIUM CHLORIDE 0.9 % IR SOLN
Status: DC | PRN
  Administered 2019-12-03: 7000 mL

## 2019-12-03 MED ORDER — ONDANSETRON HCL 4 MG/2ML IJ SOLN
INTRAMUSCULAR | Status: AC
  Filled 2019-12-03: qty 2

## 2019-12-03 MED ORDER — SODIUM CHLORIDE 0.9 % IV BOLUS
1000.0000 mL | Freq: Once | INTRAVENOUS | Status: AC
  Administered 2019-12-03: 1000 mL via INTRAVENOUS

## 2019-12-03 MED ORDER — CEFAZOLIN SODIUM-DEXTROSE 2-3 GM-%(50ML) IV SOLR
INTRAVENOUS | Status: DC | PRN
  Administered 2019-12-03: 2 g via INTRAVENOUS

## 2019-12-03 MED ORDER — TOBRAMYCIN SULFATE 1.2 G IJ SOLR
INTRAMUSCULAR | Status: DC | PRN
  Administered 2019-12-03: 1.2 g via TOPICAL

## 2019-12-03 MED ORDER — IOHEXOL 300 MG/ML  SOLN
100.0000 mL | Freq: Once | INTRAMUSCULAR | Status: AC | PRN
  Administered 2019-12-03: 100 mL via INTRAVENOUS

## 2019-12-03 MED ORDER — HYDROMORPHONE HCL 1 MG/ML IJ SOLN
INTRAMUSCULAR | Status: AC | PRN
  Administered 2019-12-03: .25 mg via INTRAVENOUS

## 2019-12-03 MED ORDER — METOPROLOL TARTRATE 5 MG/5ML IV SOLN: 5.0000 mg | Freq: Four times a day (QID) | INTRAVENOUS | Status: DC | PRN

## 2019-12-03 MED ORDER — SUCCINYLCHOLINE CHLORIDE 200 MG/10ML IV SOSY
PREFILLED_SYRINGE | INTRAVENOUS | Status: DC | PRN
  Administered 2019-12-03: 100 mg via INTRAVENOUS

## 2019-12-03 MED ORDER — SUGAMMADEX SODIUM 200 MG/2ML IV SOLN
INTRAVENOUS | Status: DC | PRN
  Administered 2019-12-03: 200 mg via INTRAVENOUS

## 2019-12-03 MED ORDER — PROPOFOL 10 MG/ML IV BOLUS
INTRAVENOUS | Status: AC
  Filled 2019-12-03: qty 20

## 2019-12-03 MED ORDER — LEVOTHYROXINE SODIUM 88 MCG PO TABS: 88.0000 ug | ORAL_TABLET | Freq: Every day | ORAL | Status: DC

## 2019-12-03 MED ORDER — FENTANYL CITRATE (PF) 250 MCG/5ML IJ SOLN
INTRAMUSCULAR | Status: AC
  Filled 2019-12-03: qty 5

## 2019-12-03 MED ORDER — ROCURONIUM BROMIDE 10 MG/ML (PF) SYRINGE
PREFILLED_SYRINGE | INTRAVENOUS | Status: AC
  Filled 2019-12-03: qty 10

## 2019-12-03 MED ORDER — POVIDONE-IODINE 10 % EX SWAB: 2.0000 "application " | Freq: Once | CUTANEOUS | Status: DC

## 2019-12-03 MED ORDER — MEPERIDINE HCL 25 MG/ML IJ SOLN: 6.2500 mg | INTRAMUSCULAR | Status: DC | PRN

## 2019-12-03 MED ORDER — TOBRAMYCIN SULFATE 1.2 G IJ SOLR
INTRAMUSCULAR | Status: AC
  Filled 2019-12-03: qty 1.2

## 2019-12-03 MED ORDER — CEFAZOLIN SODIUM-DEXTROSE 2-4 GM/100ML-% IV SOLN
INTRAVENOUS | Status: AC
  Filled 2019-12-03: qty 100

## 2019-12-03 MED ORDER — 0.9 % SODIUM CHLORIDE (POUR BTL) OPTIME
TOPICAL | Status: DC | PRN
  Administered 2019-12-03: 1000 mL

## 2019-12-03 MED ORDER — PANTOPRAZOLE SODIUM 40 MG IV SOLR: 40.0000 mg | Freq: Every day | INTRAVENOUS | Status: DC

## 2019-12-03 MED ORDER — BUPIVACAINE HCL (PF) 0.25 % IJ SOLN
INTRAMUSCULAR | Status: AC
  Filled 2019-12-03: qty 30

## 2019-12-03 MED ORDER — ONDANSETRON HCL 4 MG/2ML IJ SOLN: 4.0000 mg | Freq: Four times a day (QID) | INTRAMUSCULAR | Status: DC | PRN

## 2019-12-03 MED ORDER — DOCUSATE SODIUM 100 MG PO CAPS
100.0000 mg | ORAL_CAPSULE | Freq: Two times a day (BID) | ORAL | Status: DC
  Administered 2019-12-04 – 2019-12-12 (×18): 100 mg via ORAL
  Filled 2019-12-03 (×18): qty 1

## 2019-12-03 MED ORDER — GABAPENTIN 100 MG PO CAPS
100.0000 mg | ORAL_CAPSULE | Freq: Three times a day (TID) | ORAL | Status: DC
  Administered 2019-12-04 – 2019-12-12 (×28): 100 mg via ORAL
  Filled 2019-12-03 (×28): qty 1

## 2019-12-03 MED ORDER — DEXAMETHASONE SODIUM PHOSPHATE 10 MG/ML IJ SOLN
INTRAMUSCULAR | Status: AC
  Filled 2019-12-03: qty 1

## 2019-12-03 MED ORDER — METHOCARBAMOL 500 MG PO TABS: 500.0000 mg | ORAL_TABLET | Freq: Four times a day (QID) | ORAL | Status: DC | PRN

## 2019-12-03 MED ORDER — VANCOMYCIN HCL 1000 MG IV SOLR
INTRAVENOUS | Status: AC
  Filled 2019-12-03: qty 1000

## 2019-12-03 MED ORDER — PHENYLEPHRINE HCL-NACL 10-0.9 MG/250ML-% IV SOLN
INTRAVENOUS | Status: DC | PRN
  Administered 2019-12-03: 40 ug/min via INTRAVENOUS

## 2019-12-03 MED ORDER — ACETAMINOPHEN 160 MG/5ML PO SOLN: 325.0000 mg | Freq: Once | ORAL | Status: DC | PRN

## 2019-12-03 MED ORDER — CHLORHEXIDINE GLUCONATE 0.12 % MT SOLN
OROMUCOSAL | Status: AC
  Filled 2019-12-03: qty 15

## 2019-12-03 MED ORDER — PROPOFOL 10 MG/ML IV BOLUS
INTRAVENOUS | Status: DC | PRN
  Administered 2019-12-03: 160 mg via INTRAVENOUS

## 2019-12-03 MED ORDER — CHLORHEXIDINE GLUCONATE 0.12 % MT SOLN: 15.0000 mL | Freq: Once | OROMUCOSAL | Status: DC

## 2019-12-03 MED ORDER — POLYETHYLENE GLYCOL 3350 17 G PO PACK
17.0000 g | PACK | Freq: Every day | ORAL | Status: DC | PRN
  Administered 2019-12-05 – 2019-12-06 (×2): 17 g via ORAL
  Filled 2019-12-03 (×4): qty 1

## 2019-12-03 MED ORDER — ROCURONIUM BROMIDE 10 MG/ML (PF) SYRINGE
PREFILLED_SYRINGE | INTRAVENOUS | Status: AC
  Filled 2019-12-03: qty 20

## 2019-12-03 MED ORDER — ACETAMINOPHEN 325 MG PO TABS
650.0000 mg | ORAL_TABLET | Freq: Four times a day (QID) | ORAL | Status: DC
  Administered 2019-12-04 (×2): 650 mg via ORAL
  Filled 2019-12-03 (×2): qty 2

## 2019-12-03 MED ORDER — AMISULPRIDE (ANTIEMETIC) 5 MG/2ML IV SOLN: 10.0000 mg | Freq: Once | INTRAVENOUS | Status: DC | PRN

## 2019-12-03 MED ORDER — METOCLOPRAMIDE HCL 5 MG PO TABS
5.0000 mg | ORAL_TABLET | Freq: Three times a day (TID) | ORAL | Status: DC | PRN
  Filled 2019-12-03: qty 2

## 2019-12-03 MED ORDER — LEVOTHYROXINE SODIUM 88 MCG PO TABS
88.0000 ug | ORAL_TABLET | Freq: Every day | ORAL | Status: DC
  Administered 2019-12-04 – 2019-12-12 (×9): 88 ug via ORAL
  Filled 2019-12-03 (×9): qty 1

## 2019-12-03 MED ORDER — ONDANSETRON HCL 4 MG PO TABS: 4.0000 mg | ORAL_TABLET | Freq: Four times a day (QID) | ORAL | Status: DC | PRN

## 2019-12-03 MED ORDER — HYDROMORPHONE HCL 1 MG/ML IJ SOLN: 0.2500 mg | INTRAMUSCULAR | Status: DC | PRN

## 2019-12-03 MED ORDER — ROCURONIUM BROMIDE 10 MG/ML (PF) SYRINGE
PREFILLED_SYRINGE | INTRAVENOUS | Status: DC | PRN
  Administered 2019-12-03: 50 mg via INTRAVENOUS

## 2019-12-03 MED ORDER — FENTANYL CITRATE (PF) 250 MCG/5ML IJ SOLN
INTRAMUSCULAR | Status: DC | PRN
  Administered 2019-12-03: 50 ug via INTRAVENOUS
  Administered 2019-12-03: 100 ug via INTRAVENOUS
  Administered 2019-12-03 (×2): 50 ug via INTRAVENOUS

## 2019-12-03 MED ORDER — PHENYLEPHRINE 40 MCG/ML (10ML) SYRINGE FOR IV PUSH (FOR BLOOD PRESSURE SUPPORT)
PREFILLED_SYRINGE | INTRAVENOUS | Status: DC | PRN
  Administered 2019-12-03: 160 ug via INTRAVENOUS

## 2019-12-03 MED ORDER — CITALOPRAM HYDROBROMIDE 10 MG PO TABS: 20.0000 mg | ORAL_TABLET | Freq: Every day | ORAL | Status: DC

## 2019-12-03 MED ORDER — METOCLOPRAMIDE HCL 5 MG/ML IJ SOLN: 5.0000 mg | Freq: Three times a day (TID) | INTRAMUSCULAR | Status: DC | PRN

## 2019-12-03 MED ORDER — DEXTROSE 5 % IV SOLN
3.0000 g | Freq: Once | INTRAVENOUS | Status: AC
  Administered 2019-12-03: 3 g via INTRAVENOUS
  Filled 2019-12-03: qty 3000

## 2019-12-03 MED ORDER — CITALOPRAM HYDROBROMIDE 20 MG PO TABS
20.0000 mg | ORAL_TABLET | Freq: Every day | ORAL | Status: DC
  Administered 2019-12-04 – 2019-12-12 (×9): 20 mg via ORAL
  Filled 2019-12-03 (×6): qty 1
  Filled 2019-12-03: qty 2
  Filled 2019-12-03 (×2): qty 1

## 2019-12-03 MED ORDER — PHENYLEPHRINE 40 MCG/ML (10ML) SYRINGE FOR IV PUSH (FOR BLOOD PRESSURE SUPPORT)
PREFILLED_SYRINGE | INTRAVENOUS | Status: AC
  Filled 2019-12-03: qty 10

## 2019-12-03 MED ORDER — CEFAZOLIN SODIUM-DEXTROSE 2-4 GM/100ML-% IV SOLN: 2.0000 g | INTRAVENOUS | Status: DC

## 2019-12-03 MED ORDER — ONDANSETRON HCL 4 MG/2ML IJ SOLN
INTRAMUSCULAR | Status: DC | PRN
  Administered 2019-12-03: 4 mg via INTRAVENOUS

## 2019-12-03 MED ORDER — HYDROMORPHONE HCL 1 MG/ML IJ SOLN
0.5000 mg | INTRAMUSCULAR | Status: DC | PRN
  Administered 2019-12-03: 0.5 mg via INTRAVENOUS
  Administered 2019-12-03: 0.75 mg via INTRAVENOUS
  Administered 2019-12-04: 0.5 mg via INTRAVENOUS
  Filled 2019-12-03: qty 1

## 2019-12-03 SURGICAL SUPPLY — 71 items
BENZOIN TINCTURE PRP APPL 2/3 (GAUZE/BANDAGES/DRESSINGS) ×9 IMPLANT
BIT DRILL CANN 4.5MM (BIT) ×1 IMPLANT
BIT DRILL FLUTED 2.5 (BIT) ×1 IMPLANT
BLADE SURG 11 STRL SS (BLADE) ×3 IMPLANT
BRUSH SCRUB EZ PLAIN DRY (MISCELLANEOUS) ×6 IMPLANT
CHLORAPREP W/TINT 26 (MISCELLANEOUS) ×3 IMPLANT
COVER SURGICAL LIGHT HANDLE (MISCELLANEOUS) ×9 IMPLANT
COVER WAND RF STERILE (DRAPES) ×6 IMPLANT
DERMABOND ADVANCED (GAUZE/BANDAGES/DRESSINGS) ×2
DERMABOND ADVANCED .7 DNX12 (GAUZE/BANDAGES/DRESSINGS) ×1 IMPLANT
DRAPE C-ARM 42X72 X-RAY (DRAPES) IMPLANT
DRAPE INCISE IOBAN 66X45 STRL (DRAPES) ×3 IMPLANT
DRAPE INCISE IOBAN 85X60 (DRAPES) ×3 IMPLANT
DRAPE SURG 17X23 STRL (DRAPES) ×18 IMPLANT
DRILL BIT CANN 4.5MM (BIT) ×2
DRILL BIT FLUTED 2.5 (BIT) ×3
DRSG MEPILEX BORDER 4X8 (GAUZE/BANDAGES/DRESSINGS) ×3 IMPLANT
DRSG TELFA 3X8 NADH (GAUZE/BANDAGES/DRESSINGS) ×3 IMPLANT
GAUZE XEROFORM 5X9 LF (GAUZE/BANDAGES/DRESSINGS) ×3 IMPLANT
GLOVE BIO SURGEON STRL SZ 6.5 (GLOVE) ×6 IMPLANT
GLOVE BIO SURGEON STRL SZ7.5 (GLOVE) ×12 IMPLANT
GLOVE BIO SURGEON STRL SZ8 (GLOVE) ×3 IMPLANT
GLOVE BIO SURGEONS STRL SZ 6.5 (GLOVE) ×3
GLOVE BIOGEL PI IND STRL 6.5 (GLOVE) ×1 IMPLANT
GLOVE BIOGEL PI IND STRL 7.5 (GLOVE) ×1 IMPLANT
GLOVE BIOGEL PI IND STRL 8 (GLOVE) ×1 IMPLANT
GLOVE BIOGEL PI INDICATOR 6.5 (GLOVE) ×2
GLOVE BIOGEL PI INDICATOR 7.5 (GLOVE) ×2
GLOVE BIOGEL PI INDICATOR 8 (GLOVE) ×2
GOWN STRL REUS W/ TWL LRG LVL3 (GOWN DISPOSABLE) ×2 IMPLANT
GOWN STRL REUS W/ TWL XL LVL3 (GOWN DISPOSABLE) ×1 IMPLANT
GOWN STRL REUS W/TWL LRG LVL3 (GOWN DISPOSABLE) ×4
GOWN STRL REUS W/TWL XL LVL3 (GOWN DISPOSABLE) ×2
GUIDEWIRE 2.0MM (WIRE) ×9 IMPLANT
GUIDEWIRE THREADED 2.8MM (WIRE) ×6 IMPLANT
HANDPIECE INTERPULSE COAX TIP (DISPOSABLE) ×2
KIT BASIN OR (CUSTOM PROCEDURE TRAY) ×3 IMPLANT
KIT TURNOVER KIT B (KITS) ×6 IMPLANT
MANIFOLD NEPTUNE II (INSTRUMENTS) ×3 IMPLANT
NS IRRIG 1000ML POUR BTL (IV SOLUTION) ×6 IMPLANT
PACK GENERAL/GYN (CUSTOM PROCEDURE TRAY) ×9 IMPLANT
PACK UNIVERSAL I (CUSTOM PROCEDURE TRAY) ×3 IMPLANT
PAD ARMBOARD 7.5X6 YLW CONV (MISCELLANEOUS) ×12 IMPLANT
PLATE BONE 6H 10X78MM PUBIC (Plate) ×3 IMPLANT
SCREW CANN 6.5X150 FT (Screw) ×6 IMPLANT
SCREW CORTEX 3.5 22MM (Screw) ×2 IMPLANT
SCREW CORTEX 3.5 26MM (Screw) ×2 IMPLANT
SCREW CORTEX 3.5 32MM (Screw) ×2 IMPLANT
SCREW CORTEX 3.5X40MM (Screw) ×3 IMPLANT
SCREW CORTEX 3.5X45MM (Screw) ×3 IMPLANT
SCREW CORTEX 3.5X50MM (Screw) ×4 IMPLANT
SCREW LOCK CORT ST 3.5X22 (Screw) ×1 IMPLANT
SCREW LOCK CORT ST 3.5X26 (Screw) ×1 IMPLANT
SCREW LOCK CORT ST 3.5X32 (Screw) ×1 IMPLANT
SCREW PELVIC CORT ST 3.5X50 (Screw) ×2 IMPLANT
SET HNDPC FAN SPRY TIP SCT (DISPOSABLE) ×1 IMPLANT
STAPLER VISISTAT 35W (STAPLE) ×3 IMPLANT
SUCTION FRAZIER HANDLE 10FR (MISCELLANEOUS) ×2
SUCTION TUBE FRAZIER 10FR DISP (MISCELLANEOUS) ×1 IMPLANT
SUT ETHILON 2 0 FS 18 (SUTURE) ×6 IMPLANT
SUT ETHILON 3 0 FSL (SUTURE) ×6 IMPLANT
SUT MNCRL AB 3-0 PS2 18 (SUTURE) ×6 IMPLANT
SUT VIC AB 0 CT1 27 (SUTURE) ×2
SUT VIC AB 0 CT1 27XBRD ANBCTR (SUTURE) ×1 IMPLANT
SUT VIC AB 2-0 FS1 27 (SUTURE) ×6 IMPLANT
SUT VIC AB 2-0 SH 18 (SUTURE) ×3 IMPLANT
TOWEL GREEN STERILE (TOWEL DISPOSABLE) ×9 IMPLANT
TOWEL GREEN STERILE FF (TOWEL DISPOSABLE) ×3 IMPLANT
UNDERPAD 30X36 HEAVY ABSORB (UNDERPADS AND DIAPERS) ×3 IMPLANT
WASHER FOR 5.0 SCREWS (Washer) ×6 IMPLANT
WATER STERILE IRR 1000ML POUR (IV SOLUTION) ×3 IMPLANT
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
## 2019-12-03 NOTE — ED Provider Notes (Signed)
  Physical Exam  BP 102/83 (BP Location: Right Arm)   Pulse 85   Temp 98.1 F (36.7 C) (Oral)   Resp 18   Ht 5\' 6"  (1.676 m)   Wt 79.4 kg   SpO2 92%   BMI 28.25 kg/m   Physical Exam  ED Course/Procedures     Procedures  MDM  Patient had open book fracture on xray. Trauma consult and ortho consulted. Trauma to admit. Trauma scan pending at the time of admission      Drenda Freeze, MD 12/03/19 1630

## 2019-12-03 NOTE — Anesthesia Preprocedure Evaluation (Signed)
Anesthesia Evaluation  Patient identified by MRN, date of birth, ID band Patient awake    Reviewed: Allergy & Precautions, NPO status , Patient's Chart, lab work & pertinent test results  Airway Mallampati: II  TM Distance: >3 FB Neck ROM: Full    Dental  (+) Upper Dentures, Lower Dentures   Pulmonary neg pulmonary ROS,    Pulmonary exam normal        Cardiovascular negative cardio ROS   Rhythm:Regular Rate:Normal     Neuro/Psych negative neurological ROS  negative psych ROS   GI/Hepatic Neg liver ROS, GERD  Medicated,  Endo/Other  negative endocrine ROS  Renal/GU negative Renal ROS     Musculoskeletal   Abdominal Normal abdominal exam  (+)   Peds  Hematology negative hematology ROS (+)   Anesthesia Other Findings   Reproductive/Obstetrics                            Anesthesia Physical Anesthesia Plan  ASA: II  Anesthesia Plan: General   Post-op Pain Management:    Induction: Intravenous  PONV Risk Score and Plan: 4 or greater and Ondansetron, Dexamethasone and Treatment may vary due to age or medical condition  Airway Management Planned: Oral ETT  Additional Equipment: None  Intra-op Plan:   Post-operative Plan: Extubation in OR  Informed Consent: I have reviewed the patients History and Physical, chart, labs and discussed the procedure including the risks, benefits and alternatives for the proposed anesthesia with the patient or authorized representative who has indicated his/her understanding and acceptance.     Dental advisory given  Plan Discussed with: CRNA  Anesthesia Plan Comments:         Anesthesia Quick Evaluation

## 2019-12-03 NOTE — Progress Notes (Signed)
Notified MDA pts BP 179/85.  Pt doesn't appear to be hurting RR 11 Sats 94 on 5L Simple Mask.  MD ok with pt going to back to 4N.

## 2019-12-03 NOTE — Plan of Care (Signed)
Pt calm and comfortable, daughter at bedside. PRN dilaudid helps manage pain.
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
## 2019-12-03 NOTE — Transfer of Care (Signed)
Immediate Anesthesia Transfer of Care Note  Patient: Katherine Pope  Procedure(s) Performed: EXTERNAL FIXATION PELVIS (N/A ) EXAM UNDER ANESTHESIA WITH CLOSURE OF PERINEAL WOUNDS. (N/A )  Patient Location: PACU  Anesthesia Type:General  Level of Consciousness: drowsy  Airway & Oxygen Therapy: Patient Spontanous Breathing and Patient connected to face mask oxygen  Post-op Assessment: Report given to RN and Post -op Vital signs reviewed and stable  Post vital signs: Reviewed and stable  Last Vitals:  Vitals Value Taken Time  BP 173/88 12/03/19 2230  Temp    Pulse 110 12/03/19 2231  Resp 15 12/03/19 2231  SpO2 94 % 12/03/19 2231  Vitals shown include unvalidated device data.  Last Pain:  Vitals:   12/03/19 1659  TempSrc: Oral  PainSc:          Complications: No complications documented.

## 2019-12-03 NOTE — Anesthesia Procedure Notes (Signed)
Procedure Name: Intubation Date/Time: 12/03/2019 7:15 PM Performed by: Clovis Cao, CRNA Pre-anesthesia Checklist: Patient identified, Emergency Drugs available, Suction available, Patient being monitored and Timeout performed Patient Re-evaluated:Patient Re-evaluated prior to induction Oxygen Delivery Method: Circle system utilized Preoxygenation: Pre-oxygenation with 100% oxygen Induction Type: IV induction, Rapid sequence and Cricoid Pressure applied Laryngoscope Size: Miller and 2 Grade View: Grade I Tube type: Oral Tube size: 7.0 mm Number of attempts: 1 Airway Equipment and Method: Stylet Placement Confirmation: ETT inserted through vocal cords under direct vision,  positive ETCO2 and breath sounds checked- equal and bilateral Secured at: 21 cm Tube secured with: Tape Dental Injury: Teeth and Oropharynx as per pre-operative assessment
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
## 2019-12-04 ENCOUNTER — Inpatient Hospital Stay (HOSPITAL_COMMUNITY): Payer: Medicare HMO

## 2019-12-04 ENCOUNTER — Encounter (HOSPITAL_COMMUNITY): Payer: Self-pay

## 2019-12-04 LAB — COMPREHENSIVE METABOLIC PANEL
ALT: 69 U/L — ABNORMAL HIGH (ref 0–44)
AST: 79 U/L — ABNORMAL HIGH (ref 15–41)
Albumin: 3.2 g/dL — ABNORMAL LOW (ref 3.5–5.0)
Alkaline Phosphatase: 96 U/L (ref 38–126)
Anion gap: 13 (ref 5–15)
BUN: 16 mg/dL (ref 8–23)
CO2: 19 mmol/L — ABNORMAL LOW (ref 22–32)
Calcium: 8.2 mg/dL — ABNORMAL LOW (ref 8.9–10.3)
Chloride: 104 mmol/L (ref 98–111)
Creatinine, Ser: 0.89 mg/dL (ref 0.44–1.00)
GFR calc Af Amer: >60 mL/min (ref >60)
GFR calc non Af Amer: >60 mL/min (ref >60)
Glucose, Bld: 222 mg/dL — ABNORMAL HIGH (ref 70–99)
Potassium: 4.6 mmol/L (ref 3.5–5.1)
Sodium: 136 mmol/L (ref 135–145)
Total Bilirubin: 0.7 mg/dL (ref 0.3–1.2)
Total Protein: 5.4 g/dL — ABNORMAL LOW (ref 6.5–8.1)

## 2019-12-04 LAB — CBC
HCT: 35.8 % — ABNORMAL LOW (ref 36.0–46.0)
Hemoglobin: 11.1 g/dL — ABNORMAL LOW (ref 12.0–15.0)
MCH: 30.6 pg (ref 26.0–34.0)
MCHC: 31 g/dL (ref 30.0–36.0)
MCV: 98.6 fL (ref 80.0–100.0)
Platelets: 212 10*3/uL (ref 150–400)
RBC: 3.63 MIL/uL — ABNORMAL LOW (ref 3.87–5.11)
RDW: 13.2 % (ref 11.5–15.5)
WBC: 10.3 10*3/uL (ref 4.0–10.5)
nRBC: 0 % (ref 0.0–0.2)

## 2019-12-04 LAB — VITAMIN D 25 HYDROXY (VIT D DEFICIENCY, FRACTURES): Vit D, 25-Hydroxy: 21 ng/mL — ABNORMAL LOW (ref 30–100)

## 2019-12-04 LAB — ABO/RH: ABO/RH(D): A POS

## 2019-12-04 MED ORDER — METHOCARBAMOL 500 MG PO TABS
1000.0000 mg | ORAL_TABLET | Freq: Three times a day (TID) | ORAL | Status: DC
  Administered 2019-12-04 – 2019-12-12 (×27): 1000 mg via ORAL
  Filled 2019-12-04 (×27): qty 2

## 2019-12-04 MED ORDER — ACETAMINOPHEN 500 MG PO TABS
1000.0000 mg | ORAL_TABLET | Freq: Four times a day (QID) | ORAL | Status: DC
  Administered 2019-12-04 – 2019-12-12 (×31): 1000 mg via ORAL
  Filled 2019-12-04 (×33): qty 2

## 2019-12-04 MED ORDER — KETOROLAC TROMETHAMINE 15 MG/ML IJ SOLN
15.0000 mg | Freq: Four times a day (QID) | INTRAMUSCULAR | Status: AC
  Administered 2019-12-04 – 2019-12-08 (×17): 15 mg via INTRAVENOUS
  Filled 2019-12-04 (×17): qty 1

## 2019-12-04 MED ORDER — METHOCARBAMOL 1000 MG/10ML IJ SOLN
1000.0000 mg | Freq: Three times a day (TID) | INTRAVENOUS | Status: DC
  Filled 2019-12-04 (×29): qty 10

## 2019-12-04 MED ORDER — VITAMIN D 25 MCG (1000 UNIT) PO TABS
2000.0000 [IU] | ORAL_TABLET | Freq: Every day | ORAL | Status: DC
  Administered 2019-12-04 – 2019-12-12 (×9): 2000 [IU] via ORAL
  Filled 2019-12-04 (×9): qty 2

## 2019-12-04 MED ORDER — LORAZEPAM 0.5 MG PO TABS
0.5000 mg | ORAL_TABLET | Freq: Four times a day (QID) | ORAL | Status: DC | PRN
  Administered 2019-12-05: 0.5 mg via ORAL
  Administered 2019-12-06 – 2019-12-12 (×7): 1 mg via ORAL
  Filled 2019-12-04 (×4): qty 2
  Filled 2019-12-04: qty 1
  Filled 2019-12-04 (×3): qty 2
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
## 2019-12-04 NOTE — TOC CAGE-AID Note (Signed)
Transition of Care [facility]) - CAGE-AID Screening   Patient Details  Name: Katherine Pope MRN: [id number] Date of Birth: 01-16-1951  [facility] CM/SW Contact:    Emeterio Reeve, [place] Phone Number: 12/04/2019, 4:16 PM   Clinical Narrative:  CSW met with pt at bedside. CSW introduced self and explained her role at the hospital.  Pt denied alochol and substance use. No resources where needed at this time.   CAGE-AID Screening:    Have You Ever Felt You Ought to Cut Down on Your Drinking or Drug Use?: No Have People Annoyed You By Critizing Your Drinking Or Drug Use?: No Have You Felt Bad Or Guilty About Your Drinking Or Drug Use?: No Have You Ever Had a Drink or Used Drugs First Thing In The Morning to Steady Your Nerves or to Get Rid of a Hangover?: No CAGE-AID Score: 0  Substance Abuse Education Offered: Yes    Blima Ledger, [place] Social Worker [phone]
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
## 2019-12-04 NOTE — ED Notes (Signed)
Remaining dose of 0.75mg  of Dilaudid from vial pulled in the ED was given by heather satterfield upon arrival to 4N28 per verbal order from Dr. Bobbye Morton.

## 2019-12-04 NOTE — Progress Notes (Signed)
Report called to [facility]. Patient taken to 5N16, no belongings to take.  Daughter Tammy made aware of transfer.

## 2019-12-04 NOTE — Anesthesia Postprocedure Evaluation (Signed)
Anesthesia Post Note  Patient: Katherine Pope  Procedure(s) Performed: EXTERNAL FIXATION PELVIS (N/A ) EXAM UNDER ANESTHESIA WITH CLOSURE OF PERINEAL WOUNDS. (N/A )     Patient location during evaluation: PACU Anesthesia Type: General Level of consciousness: awake and alert Pain management: pain level controlled Vital Signs Assessment: post-procedure vital signs reviewed and stable Respiratory status: spontaneous breathing, nonlabored ventilation, respiratory function stable and patient connected to nasal cannula oxygen Cardiovascular status: blood pressure returned to baseline and stable Postop Assessment: no apparent nausea or vomiting Anesthetic complications: no   No complications documented.  Last Vitals:  Vitals:   12/04/19 0500 12/04/19 0600  BP: (!) 101/57 (!) 109/55  Pulse: 77 76  Resp: (!) 8 12  Temp:    SpO2: 95% 98%    Last Pain:  Vitals:   12/04/19 0430  TempSrc:   PainSc: [place]

## 2019-12-05 ENCOUNTER — Encounter (HOSPITAL_COMMUNITY): Payer: Self-pay | Admitting: Student

## 2019-12-05 DIAGNOSIS — E039 Hypothyroidism, unspecified: Secondary | ICD-10-CM

## 2019-12-05 DIAGNOSIS — S3141XA Laceration without foreign body of vagina and vulva, initial encounter: Secondary | ICD-10-CM

## 2019-12-05 DIAGNOSIS — S334XXA Traumatic rupture of symphysis pubis, initial encounter: Secondary | ICD-10-CM

## 2019-12-05 DIAGNOSIS — S81012A Laceration without foreign body, left knee, initial encounter: Secondary | ICD-10-CM

## 2019-12-05 DIAGNOSIS — S32810B Multiple fractures of pelvis with stable disruption of pelvic ring, initial encounter for open fracture: Secondary | ICD-10-CM

## 2019-12-05 LAB — CBC
HCT: 28.3 % — ABNORMAL LOW (ref 36.0–46.0)
HCT: 25.6 % — ABNORMAL LOW (ref 36.0–46.0)
Hemoglobin: 8.5 g/dL — ABNORMAL LOW (ref 12.0–15.0)
Hemoglobin: 7.9 g/dL — ABNORMAL LOW (ref 12.0–15.0)
MCH: 30.5 pg (ref 26.0–34.0)
MCH: 30 pg (ref 26.0–34.0)
MCHC: 30 g/dL (ref 30.0–36.0)
MCHC: 30.9 g/dL (ref 30.0–36.0)
MCV: 101.4 fL — ABNORMAL HIGH (ref 80.0–100.0)
MCV: 97.3 fL (ref 80.0–100.0)
Platelets: 153 10*3/uL (ref 150–400)
Platelets: 138 10*3/uL — ABNORMAL LOW (ref 150–400)
RBC: 2.79 MIL/uL — ABNORMAL LOW (ref 3.87–5.11)
RBC: 2.63 MIL/uL — ABNORMAL LOW (ref 3.87–5.11)
RDW: 13.5 % (ref 11.5–15.5)
RDW: 13.4 % (ref 11.5–15.5)
WBC: 6.3 10*3/uL (ref 4.0–10.5)
WBC: 5.5 10*3/uL (ref 4.0–10.5)
nRBC: 0 % (ref 0.0–0.2)
nRBC: 0 % (ref 0.0–0.2)

## 2019-12-05 MED ORDER — SODIUM CHLORIDE 0.9 % IV SOLN: INTRAVENOUS | Status: DC
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
## 2019-12-05 NOTE — Plan of Care (Signed)
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
## 2019-12-06 LAB — CBC
HCT: 24.3 % — ABNORMAL LOW (ref 36.0–46.0)
Hemoglobin: 7.4 g/dL — ABNORMAL LOW (ref 12.0–15.0)
MCH: 29.7 pg (ref 26.0–34.0)
MCHC: 30.5 g/dL (ref 30.0–36.0)
MCV: 97.6 fL (ref 80.0–100.0)
Platelets: 139 10*3/uL — ABNORMAL LOW (ref 150–400)
RBC: 2.49 MIL/uL — ABNORMAL LOW (ref 3.87–5.11)
RDW: 13.4 % (ref 11.5–15.5)
WBC: 5.5 10*3/uL (ref 4.0–10.5)
nRBC: 0 % (ref 0.0–0.2)

## 2019-12-06 MED ORDER — PANTOPRAZOLE SODIUM 40 MG PO TBEC
40.0000 mg | DELAYED_RELEASE_TABLET | Freq: Every day | ORAL | Status: DC
  Administered 2019-12-06 – 2019-12-12 (×7): 40 mg via ORAL
  Filled 2019-12-06 (×7): qty 1
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
## 2019-12-07 LAB — CBC
HCT: 26.1 % — ABNORMAL LOW (ref 36.0–46.0)
Hemoglobin: 8.3 g/dL — ABNORMAL LOW (ref 12.0–15.0)
MCH: 30.5 pg (ref 26.0–34.0)
MCHC: 31.8 g/dL (ref 30.0–36.0)
MCV: 96 fL (ref 80.0–100.0)
Platelets: 166 10*3/uL (ref 150–400)
RBC: 2.72 MIL/uL — ABNORMAL LOW (ref 3.87–5.11)
RDW: 13.4 % (ref 11.5–15.5)
WBC: 5 10*3/uL (ref 4.0–10.5)
nRBC: 0 % (ref 0.0–0.2)
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
## 2019-12-07 NOTE — Plan of Care (Signed)
  Problem: Pain Managment: Goal: General experience of comfort will improve Outcome: Progressing   Problem: Safety: Goal: Ability to remain free from injury will improve Outcome: Progressing   Problem: Skin Integrity: Goal: Risk for impaired skin integrity will decrease Outcome: Progressing   

## 2019-12-07 NOTE — Plan of Care (Signed)
  Problem: Education: Goal: Verbalization of understanding the information provided (i.e., activity precautions, restrictions, etc) will improve Outcome: Progressing Goal: Individualized Educational Video(s) Outcome: Progressing   Problem: Activity: Goal: Ability to ambulate and perform ADLs will improve Outcome: Progressing   Problem: Clinical Measurements: Goal: Postoperative complications will be avoided or minimized Outcome: Progressing   Problem: Self-Concept: Goal: Ability to maintain and perform role responsibilities to the fullest extent possible will improve Outcome: Progressing   
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
## 2019-12-08 NOTE — TOC Progression Note (Signed)
Transition of Care [facility]) - Progression Note    Patient Details  Name: MEGYN LENG MRN: [id number] Date of Birth: 1951-02-05  Transition of Care [facility] CM/SW Contact  Oren Section Cleta Alberts, RN Phone Number: 12/08/2019, 4:36 PM  Clinical Narrative: Patient with good motivation and progress with therapy today; recommendation updated to inpatient rehab.  Notified patient's daughter, Lynelle Smoke, of plan for inpatient rehab, with backup plan for skilled nursing facility.  Reviewed current bed offers with daughter.  We will continue to follow progress.    Expected Discharge Plan: IP Rehab Facility Barriers to Discharge: Continued Medical Work up  Expected Discharge Plan and Services Expected Discharge Plan: [place]   Discharge Planning Services: CM Consult Post Acute Care Choice: [place] arrangements for the past 2 months: Single Family Home                                       Social Determinants of Health (SDOH) Interventions    Readmission Risk Interventions No flowsheet data found.  Reinaldo Raddle, RN, BSN  Trauma/Neuro ICU Case Manager [phone]

## 2019-12-08 NOTE — Progress Notes (Signed)
Inpatient Rehab Admissions Coordinator Note:   Per therapy recommendations, pt was screened for CIR candidacy by Clatie Kessen, MS CCC-SLP. At this time, Pt. Appears to have functional decline and is a good candidate for CIR. Will place order for rehab consult per protocol.  Please contact me with questions.   Atley Neubert, MS, CCC-SLP Rehab Admissions Coordinator  [phone] (celll) [phone] (office)  
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
## 2019-12-08 NOTE — Plan of Care (Signed)
  Problem: Pain Managment: Goal: General experience of comfort will improve Outcome: Progressing   Problem: Safety: Goal: Ability to remain free from injury will improve Outcome: Progressing   Problem: Skin Integrity: Goal: Risk for impaired skin integrity will decrease Outcome: Progressing   
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
## 2019-12-08 NOTE — Plan of Care (Signed)

## 2019-12-09 ENCOUNTER — Encounter (HOSPITAL_COMMUNITY): Payer: Self-pay

## 2019-12-09 NOTE — Plan of Care (Signed)

## 2019-12-09 NOTE — Progress Notes (Signed)
Inpatient Rehabilitation Admissions Coordinator  Inpatient rehab consult received. I met with patient at bedside for rehab assessment and then spoke with her daughter, Lynelle Smoke by phone. We discussed goals and expectations of an inpt rehab admit. They prefer CIR to SNF. I will begin insurance authorization with [facility] medicare and follow up tomorrow with their decision.  Danne Baxter, RN, MSN Rehab Admissions Coordinator [phone] 12/09/2019 10:19 AM
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
## 2019-12-10 LAB — CREATININE, SERUM
Creatinine, Ser: 0.68 mg/dL (ref 0.44–1.00)
GFR calc Af Amer: >60 mL/min (ref >60)
GFR calc non Af Amer: >60 mL/min (ref >60)

## 2019-12-10 LAB — SARS CORONAVIRUS 2 BY RT PCR (HOSPITAL ORDER, PERFORMED IN ~~LOC~~ HOSPITAL LAB): SARS Coronavirus 2: NEGATIVE

## 2019-12-10 MED ORDER — CALCIUM CARBONATE ANTACID 500 MG PO CHEW
1.0000 | CHEWABLE_TABLET | Freq: Three times a day (TID) | ORAL | Status: DC
  Administered 2019-12-10 – 2019-12-12 (×6): 200 mg via ORAL
  Filled 2019-12-10 (×6): qty 1
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
## 2019-12-10 NOTE — TOC Progression Note (Signed)
Transition of Care [facility]) - Progression Note    Patient Details  Name: DAYANNE YIU MRN: [id number] Date of Birth: 02-17-1951  Transition of Care [facility]) CM/SW Contact  Oren Section Cleta Alberts, RN Phone Number: 12/10/2019, 2:23 PM  Clinical Narrative: Patient has been denied for inpatient rehab admission by insurance.  She will needs skilled nursing facility placement for rehab.  Notified patient and her daughter, Lynelle Smoke.  Per patient request, all bed offers given to her daughter; Lynelle Smoke to review bed offers and have bed selection made by first thing in the morning.  Will follow up with daughter in a.m.    Expected Discharge Plan: Skilled Nursing Facility Barriers to Discharge: Insurance Authorization  Expected Discharge Plan and Services Expected Discharge Plan: [place]   Discharge Planning Services: CM Consult Post Acute Care Choice: Hospice Living arrangements for the past 2 months: Single Family Home                                       Social Determinants of Health (SDOH) Interventions    Readmission Risk Interventions No flowsheet data found.  Reinaldo Raddle, RN, BSN  Trauma/Neuro ICU Case Manager [phone]
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
## 2019-12-10 NOTE — Plan of Care (Signed)

## 2019-12-10 NOTE — Progress Notes (Signed)
Inpatient Rehabilitation Admissions Coordinator  Insurance has denied Cir approval to admit after peer to peer. I met with patient at bedside and she is aware. We will sign off at this time.  Danne Baxter, RN, MSN Rehab Admissions Coordinator [phone] 12/10/2019 2:21 PM

## 2019-12-11 NOTE — Plan of Care (Signed)
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
## 2019-12-11 NOTE — TOC Progression Note (Signed)
Transition of Care [facility]) - Progression Note    Patient Details  Name: Katherine Pope MRN: [id number] Date of Birth: 01-10-1951  [facility]) CM/SW Contact  Ella Bodo, RN Phone Number: 12/11/2019, 1010 Clinical Narrative: Spoke with patient's daughter, Tammy this morning; patient/family have chosen [facility] in [place] for skilled nursing facility.  Left message for Jackelyn Poling in admissions at [facility] health to initiate insurance authorization for SNF admission.  Will follow with updates as available.    Expected Discharge Plan: Skilled Nursing Facility Barriers to Discharge: Ship broker  Expected Discharge Plan and Services Expected Discharge Plan: [place]   Discharge Planning Services: CM Consult Post Acute Care Choice: [place] Living arrangements for the past 2 months: Single Family Home                                       Social Determinants of Health (SDOH) Interventions    Readmission Risk Interventions No flowsheet data found.   Reinaldo Raddle, RN, BSN  Trauma/Neuro ICU Case Manager [phone]
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
## 2019-12-12 DIAGNOSIS — M6259 Muscle wasting and atrophy, not elsewhere classified, multiple sites: Secondary | ICD-10-CM | POA: Diagnosis not present

## 2019-12-12 DIAGNOSIS — R5381 Other malaise: Secondary | ICD-10-CM | POA: Diagnosis not present

## 2019-12-12 DIAGNOSIS — S3141XD Laceration without foreign body of vagina and vulva, subsequent encounter: Secondary | ICD-10-CM | POA: Diagnosis not present

## 2019-12-12 DIAGNOSIS — R0902 Hypoxemia: Secondary | ICD-10-CM | POA: Diagnosis not present

## 2019-12-12 DIAGNOSIS — M2550 Pain in unspecified joint: Secondary | ICD-10-CM | POA: Diagnosis not present

## 2019-12-12 DIAGNOSIS — S329XXA Fracture of unspecified parts of lumbosacral spine and pelvis, initial encounter for closed fracture: Secondary | ICD-10-CM | POA: Diagnosis not present

## 2019-12-12 DIAGNOSIS — E039 Hypothyroidism, unspecified: Secondary | ICD-10-CM | POA: Diagnosis not present

## 2019-12-12 DIAGNOSIS — S32810B Multiple fractures of pelvis with stable disruption of pelvic ring, initial encounter for open fracture: Secondary | ICD-10-CM | POA: Diagnosis not present

## 2019-12-12 DIAGNOSIS — M6281 Muscle weakness (generalized): Secondary | ICD-10-CM | POA: Diagnosis not present

## 2019-12-12 DIAGNOSIS — S32811A Multiple fractures of pelvis with unstable disruption of pelvic ring, initial encounter for closed fracture: Secondary | ICD-10-CM | POA: Diagnosis not present

## 2019-12-12 DIAGNOSIS — F419 Anxiety disorder, unspecified: Secondary | ICD-10-CM | POA: Diagnosis not present

## 2019-12-12 DIAGNOSIS — S334XXA Traumatic rupture of symphysis pubis, initial encounter: Secondary | ICD-10-CM | POA: Diagnosis not present

## 2019-12-12 DIAGNOSIS — E559 Vitamin D deficiency, unspecified: Secondary | ICD-10-CM | POA: Diagnosis not present

## 2019-12-12 DIAGNOSIS — Z7401 Bed confinement status: Secondary | ICD-10-CM | POA: Diagnosis not present

## 2019-12-12 DIAGNOSIS — S3141XA Laceration without foreign body of vagina and vulva, initial encounter: Secondary | ICD-10-CM | POA: Diagnosis not present

## 2019-12-12 DIAGNOSIS — F329 Major depressive disorder, single episode, unspecified: Secondary | ICD-10-CM | POA: Diagnosis not present

## 2019-12-12 DIAGNOSIS — S32810D Multiple fractures of pelvis with stable disruption of pelvic ring, subsequent encounter for fracture with routine healing: Secondary | ICD-10-CM | POA: Diagnosis not present

## 2019-12-12 DIAGNOSIS — F339 Major depressive disorder, recurrent, unspecified: Secondary | ICD-10-CM | POA: Diagnosis not present

## 2019-12-12 MED ORDER — ACETAMINOPHEN 500 MG PO TABS: 1000.0000 mg | ORAL_TABLET | Freq: Four times a day (QID) | ORAL | 0 refills | Status: DC

## 2019-12-12 MED ORDER — POLYETHYLENE GLYCOL 3350 17 G PO PACK: 17.0000 g | PACK | Freq: Every day | ORAL | 0 refills | Status: DC | PRN

## 2019-12-12 MED ORDER — VITAMIN D3 25 MCG PO TABS: 2000.0000 [IU] | ORAL_TABLET | Freq: Every day | ORAL | Status: DC

## 2019-12-12 MED ORDER — OXYCODONE HCL 5 MG PO TABS: 5.0000 mg | ORAL_TABLET | ORAL | 0 refills | Status: DC | PRN

## 2019-12-12 MED ORDER — GABAPENTIN 100 MG PO CAPS: 100.0000 mg | ORAL_CAPSULE | Freq: Three times a day (TID) | ORAL | Status: DC

## 2019-12-12 MED ORDER — DOCUSATE SODIUM 100 MG PO CAPS: 100.0000 mg | ORAL_CAPSULE | Freq: Two times a day (BID) | ORAL | 0 refills | Status: DC

## 2019-12-12 MED ORDER — ENOXAPARIN SODIUM 40 MG/0.4ML ~~LOC~~ SOLN: 40.0000 mg | SUBCUTANEOUS | 0 refills | Status: DC

## 2019-12-12 MED ORDER — CALCIUM CARBONATE ANTACID 500 MG PO CHEW: 1.0000 | CHEWABLE_TABLET | Freq: Three times a day (TID) | ORAL | Status: DC

## 2019-12-12 MED ORDER — METHOCARBAMOL 500 MG PO TABS: 1000.0000 mg | ORAL_TABLET | Freq: Three times a day (TID) | ORAL | Status: DC

## 2019-12-12 MED ORDER — LORAZEPAM 0.5 MG PO TABS: 0.5000 mg | ORAL_TABLET | Freq: Four times a day (QID) | ORAL | 0 refills | Status: AC | PRN
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
## 2019-12-12 NOTE — TOC Transition Note (Signed)
Transition of Care [facility]) - CM/SW Discharge Note   Patient Details  Name: Katherine Pope MRN: [id number] Date of Birth: Sep 05, 1950  Transition of Care [facility]) CM/SW Contact:  Ella Bodo, RN Phone Number: 12/12/2019, 1:30  Clinical Narrative:   Patient medically stable for discharge to skilled nursing facility and insurance authorization has been obtained for admission to [facility] SNF today.  Notified patient and daughter; patient declines Covid vaccine prior to admission to facility.  Discharge summary sent to SNF.  Patient will be admitted to room B-22-1; bedside nurse to call report to [phone].    [facility] called for transport at 2:30 PM.    Final next level of care: [place] Barriers to Discharge: Barriers Resolved   Patient Goals and CMS Choice   CMS Medicare.gov Compare Post Acute Care list provided to:: Patient Represenative (must comment) (daughter, Tammy) Choice offered to / list presented to : Adult Children  Discharge Placement                       Discharge Plan and Services   Discharge Planning Services: CM Consult Post Acute Care Choice: Skilled Nursing Facility                               Social Determinants of Health (SDOH) Interventions     Readmission Risk Interventions Readmission Risk Prevention Plan 12/12/2019  Post Dischage Appt Not Complete  Appt Comments Dc to SNF  Medication Screening Complete  Transportation Screening Complete   Reinaldo Raddle, RN, BSN  Trauma/Neuro ICU Case Manager [phone]

## 2019-12-12 NOTE — Plan of Care (Addendum)
Changed right arm dressing. Minimal amount of purulent drainage. Site pink, red, yelllow, painful. Xeroform, gauze, Kerlix used for dressing change. Pt tolerated dressing change well.   Problem: Education: Goal: Verbalization of understanding the information provided (i.e., activity precautions, restrictions, etc) will improve Outcome: Progressing   Problem: Activity: Goal: Ability to ambulate and perform ADLs will improve Outcome: Progressing   Problem: Pain Management: Goal: Pain level will decrease Outcome: Progressing

## 2019-12-12 NOTE — Plan of Care (Signed)

## 2019-12-12 NOTE — Progress Notes (Signed)
Discharge packet provided.  The packet will be sent via [facility] to [facility].  Report was called to April

## 2019-12-12 NOTE — Progress Notes (Signed)
Pt given PM medications and prn oxycodone and lorazepam per her request. Pt in stable condition. VSS. Pt discharged to [facility] and transported via [place].
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
## 2019-12-12 NOTE — Progress Notes (Deleted)
Received from PACU

## 2019-12-12 NOTE — Discharge Instructions (Addendum)
Orthopaedic Trauma Service Discharge Instructions   General Discharge Instructions  WEIGHT BEARING STATUS: Non-weightbearing left lower extremity, Weightbearing for pivot transfers right lower extremity  RANGE OF MOTION/ACTIVITY: Okay for unrestricted hip and knee motion  Wound Care: Incisions can be left open to air if there is no drainage. If incision continues to have drainage, follow wound care instructions below. Okay to shower if no drainage from incisions.  DVT/PE prophylaxis: Lovenox  Diet: as you were eating previously.  Can use over the counter stool softeners and bowel preparations, such as Miralax, to help with bowel movements.  Narcotics can be constipating.  Be sure to drink plenty of fluids  PAIN MEDICATION USE AND EXPECTATIONS  You have likely been given narcotic medications to help control your pain.  After a traumatic event that results in an fracture (broken bone) with or without surgery, it is ok to use narcotic pain medications to help control one's pain.  We understand that everyone responds to pain differently and each individual patient will be evaluated on a regular basis for the continued need for narcotic medications. Ideally, narcotic medication use should last no more than 6-8 weeks (coinciding with fracture healing).   As a patient it is your responsibility as well to monitor narcotic medication use and report the amount and frequency you use these medications when you come to your office visit.   We would also advise that if you are using narcotic medications, you should take a dose prior to therapy to maximize you participation.  IF YOU ARE ON NARCOTIC MEDICATIONS IT IS NOT PERMISSIBLE TO OPERATE A MOTOR VEHICLE (MOTORCYCLE/CAR/TRUCK/MOPED) OR HEAVY MACHINERY DO NOT MIX NARCOTICS WITH OTHER CNS (CENTRAL NERVOUS SYSTEM) DEPRESSANTS SUCH AS ALCOHOL   STOP SMOKING OR USING NICOTINE PRODUCTS!!!!  As discussed nicotine severely impairs your body's ability to  heal surgical and traumatic wounds but also impairs bone healing.  Wounds and bone heal by forming microscopic blood vessels (angiogenesis) and nicotine is a vasoconstrictor (essentially, shrinks blood vessels).  Therefore, if vasoconstriction occurs to these microscopic blood vessels they essentially disappear and are unable to deliver necessary nutrients to the healing tissue.  This is one modifiable factor that you can do to dramatically increase your chances of healing your injury.    (This means no smoking, no nicotine gum, patches, etc)  DO NOT USE NONSTEROIDAL ANTI-INFLAMMATORY DRUGS (NSAID'S)  Using products such as Advil (ibuprofen), Aleve (naproxen), Motrin (ibuprofen) for additional pain control during fracture healing can delay and/or prevent the healing response.  If you would like to take over the counter (OTC) medication, Tylenol (acetaminophen) is ok.  However, some narcotic medications that are given for pain control contain acetaminophen as well. Therefore, you should not exceed more than 4000 mg of tylenol in a day if you do not have liver disease.  Also note that there are may OTC medicines, such as cold medicines and allergy medicines that my contain tylenol as well.  If you have any questions about medications and/or interactions please ask your doctor/PA or your pharmacist.      ICE AND ELEVATE INJURED/OPERATIVE EXTREMITY  Using ice and elevating the injured extremity above your heart can help with swelling and pain control.  Icing in a pulsatile fashion, such as 20 minutes on and 20 minutes off, can be followed.    Do not place ice directly on skin. Make sure there is a barrier between to skin and the ice pack.    Using frozen items such  as frozen peas works well as the conform nicely to the are that needs to be iced.  USE AN ACE WRAP OR TED HOSE FOR SWELLING CONTROL  In addition to icing and elevation, Ace wraps or TED hose are used to help limit and resolve swelling.  It is  recommended to use Ace wraps or TED hose until you are informed to stop.    When using Ace Wraps start the wrapping distally (farthest away from the body) and wrap proximally (closer to the body)   Example: If you had surgery on your leg or thing and you do not have a splint on, start the ace wrap at the toes and work your way up to the thigh        If you had surgery on your upper extremity and do not have a splint on, start the ace wrap at your fingers and work your way up to the upper arm   [place]: [phone]   VISIT OUR WEBSITE FOR ADDITIONAL INFORMATION: [email or web address]      Discharge Wound Care Instructions  Do NOT apply any ointments, solutions or lotions to pin sites or surgical wounds.  These prevent needed drainage and even though solutions like hydrogen peroxide kill bacteria, they also damage cells lining the pin sites that help fight infection.  Applying lotions or ointments can keep the wounds moist and can cause them to breakdown and open up as well. This can increase the risk for infection. When in doubt call the office.  Surgical incisions should be dressed daily.  If any drainage is noted, use one layer of adaptic, then gauze, Kerlix, and an ace wrap.  Once the incision is completely dry and without drainage, it may be left open to air out.  Showering may begin 36-48 hours later.  Cleaning gently with soap and water.  Traumatic wounds should be dressed daily as well.    One layer of adaptic, gauze, Kerlix, then ace wrap.  The adaptic can be discontinued once the draining has ceased    If you have a wet to dry dressing: wet the gauze with saline the squeeze as much saline out so the gauze is moist (not soaking wet), place moistened gauze over wound, then place a dry gauze over the moist one, followed by Kerlix wrap, then ace wrap.

## 2019-12-12 NOTE — Plan of Care (Signed)
Problem: Education: Goal: Verbalization of understanding the information provided (i.e., activity precautions, restrictions, etc) will improve 12/12/2019 1131 by Madaline Brilliant, RN Outcome: Progressing 12/12/2019 0836 by Madaline Brilliant, RN Outcome: Progressing Goal: Individualized Educational Video(s) 12/12/2019 1131 by Madaline Brilliant, RN Outcome: Progressing 12/12/2019 0836 by Madaline Brilliant, RN Outcome: Progressing   Problem: Activity: Goal: Ability to ambulate and perform ADLs will improve 12/12/2019 1131 by Madaline Brilliant, RN Outcome: Progressing 12/12/2019 0836 by Madaline Brilliant, RN Outcome: Progressing   Problem: Clinical Measurements: Goal: Postoperative complications will be avoided or minimized 12/12/2019 1131 by Madaline Brilliant, RN Outcome: Progressing 12/12/2019 0836 by Madaline Brilliant, RN Outcome: Progressing   Problem: Self-Concept: Goal: Ability to maintain and perform role responsibilities to the fullest extent possible will improve 12/12/2019 1131 by Madaline Brilliant, RN Outcome: Progressing 12/12/2019 0836 by Madaline Brilliant, RN Outcome: Progressing   Problem: Pain Management: Goal: Pain level will decrease 12/12/2019 1131 by Madaline Brilliant, RN Outcome: Progressing 12/12/2019 0836 by Madaline Brilliant, RN Outcome: Progressing   Problem: Education: Goal: Knowledge of General Education information will improve Description: Including pain rating scale, medication(s)/side effects and non-pharmacologic comfort measures 12/12/2019 1131 by Madaline Brilliant, RN Outcome: Progressing 12/12/2019 0836 by Madaline Brilliant, RN Outcome: Progressing   Problem: Health Behavior/Discharge Planning: Goal: Ability to manage health-related needs will improve 12/12/2019 1131 by Madaline Brilliant, RN Outcome: Progressing 12/12/2019 0836 by Madaline Brilliant, RN Outcome: Progressing   Problem: Clinical Measurements: Goal: Ability to maintain clinical measurements  within normal limits will improve 12/12/2019 1131 by Madaline Brilliant, RN Outcome: Progressing 12/12/2019 0836 by Madaline Brilliant, RN Outcome: Progressing Goal: Will remain free from infection 12/12/2019 1131 by Madaline Brilliant, RN Outcome: Progressing 12/12/2019 0836 by Madaline Brilliant, RN Outcome: Progressing Goal: Diagnostic test results will improve 12/12/2019 1131 by Madaline Brilliant, RN Outcome: Progressing 12/12/2019 0836 by Madaline Brilliant, RN Outcome: Progressing Goal: Respiratory complications will improve 12/12/2019 1131 by Madaline Brilliant, RN Outcome: Progressing 12/12/2019 0836 by Madaline Brilliant, RN Outcome: Progressing Goal: Cardiovascular complication will be avoided 12/12/2019 1131 by Madaline Brilliant, RN Outcome: Progressing 12/12/2019 0836 by Madaline Brilliant, RN Outcome: Progressing   Problem: Activity: Goal: Risk for activity intolerance will decrease 12/12/2019 1131 by Madaline Brilliant, RN Outcome: Progressing 12/12/2019 0836 by Madaline Brilliant, RN Outcome: Progressing   Problem: Nutrition: Goal: Adequate nutrition will be maintained 12/12/2019 1131 by Madaline Brilliant, RN Outcome: Progressing 12/12/2019 0836 by Madaline Brilliant, RN Outcome: Progressing   Problem: Coping: Goal: Level of anxiety will decrease 12/12/2019 1131 by Madaline Brilliant, RN Outcome: Progressing 12/12/2019 0836 by Madaline Brilliant, RN Outcome: Progressing   Problem: Elimination: Goal: Will not experience complications related to bowel motility 12/12/2019 1131 by Madaline Brilliant, RN Outcome: Progressing 12/12/2019 0836 by Madaline Brilliant, RN Outcome: Progressing Goal: Will not experience complications related to urinary retention 12/12/2019 1131 by Madaline Brilliant, RN Outcome: Progressing 12/12/2019 0836 by Madaline Brilliant, RN Outcome: Progressing   Problem: Pain Managment: Goal: General experience of comfort will improve 12/12/2019 1131 by Madaline Brilliant, RN Outcome:  Progressing 12/12/2019 0836 by Madaline Brilliant, RN Outcome: Progressing   Problem: Safety: Goal: Ability to remain free from injury will improve 12/12/2019 1131 by Madaline Brilliant, RN Outcome: Progressing 12/12/2019 0836 by Madaline Brilliant, RN Outcome: Progressing   Problem: Skin Integrity: Goal: Risk for impaired  skin integrity will decrease 12/12/2019 1131 by Madaline Brilliant, RN Outcome: Progressing 12/12/2019 0836 by Madaline Brilliant, RN Outcome: Progressing

## 2019-12-15 ENCOUNTER — Encounter (HOSPITAL_COMMUNITY): Payer: Self-pay | Admitting: Internal Medicine

## 2019-12-15 DIAGNOSIS — E039 Hypothyroidism, unspecified: Secondary | ICD-10-CM | POA: Diagnosis not present

## 2019-12-15 DIAGNOSIS — E559 Vitamin D deficiency, unspecified: Secondary | ICD-10-CM | POA: Diagnosis not present

## 2019-12-15 DIAGNOSIS — F419 Anxiety disorder, unspecified: Secondary | ICD-10-CM | POA: Diagnosis not present

## 2019-12-15 DIAGNOSIS — S32810D Multiple fractures of pelvis with stable disruption of pelvic ring, subsequent encounter for fracture with routine healing: Secondary | ICD-10-CM | POA: Diagnosis not present

## 2019-12-15 DIAGNOSIS — S3141XD Laceration without foreign body of vagina and vulva, subsequent encounter: Secondary | ICD-10-CM | POA: Diagnosis not present

## 2019-12-16 ENCOUNTER — Other Ambulatory Visit: Payer: Self-pay

## 2019-12-16 NOTE — Patient Outreach (Signed)
[facility]) Care Management  12/16/2019  Katherine Pope [birth year] [id number]     Transition of Care Referral  Referral Date: 12/16/2019 Referral Source: [facility] Discharge Report Date of Discharge: 12/12/2019 Facility: [facility]    Referral received. Upon chart review, noted that patient discharged to SNF.     Plan: RN CM will close case at this time.    Enzo Montgomery, RN,BSN,CCM [place] Management Telephonic Care Management Coordinator Direct Phone: [phone] Toll Free: [phone] Fax: [phone]

## 2019-12-17 DIAGNOSIS — E039 Hypothyroidism, unspecified: Secondary | ICD-10-CM | POA: Diagnosis not present

## 2019-12-17 DIAGNOSIS — F419 Anxiety disorder, unspecified: Secondary | ICD-10-CM | POA: Diagnosis not present

## 2019-12-17 DIAGNOSIS — S329XXA Fracture of unspecified parts of lumbosacral spine and pelvis, initial encounter for closed fracture: Secondary | ICD-10-CM | POA: Diagnosis not present

## 2019-12-17 DIAGNOSIS — E559 Vitamin D deficiency, unspecified: Secondary | ICD-10-CM | POA: Diagnosis not present

## 2019-12-23 DIAGNOSIS — F329 Major depressive disorder, single episode, unspecified: Secondary | ICD-10-CM | POA: Diagnosis not present

## 2019-12-23 DIAGNOSIS — E039 Hypothyroidism, unspecified: Secondary | ICD-10-CM | POA: Diagnosis not present

## 2019-12-25 DIAGNOSIS — S329XXA Fracture of unspecified parts of lumbosacral spine and pelvis, initial encounter for closed fracture: Secondary | ICD-10-CM | POA: Diagnosis not present

## 2019-12-25 DIAGNOSIS — E559 Vitamin D deficiency, unspecified: Secondary | ICD-10-CM | POA: Diagnosis not present

## 2019-12-25 DIAGNOSIS — E039 Hypothyroidism, unspecified: Secondary | ICD-10-CM | POA: Diagnosis not present

## 2019-12-25 DIAGNOSIS — F419 Anxiety disorder, unspecified: Secondary | ICD-10-CM | POA: Diagnosis not present

## 2019-12-25 DIAGNOSIS — S32810D Multiple fractures of pelvis with stable disruption of pelvic ring, subsequent encounter for fracture with routine healing: Secondary | ICD-10-CM | POA: Diagnosis not present

## 2019-12-30 DIAGNOSIS — S32811D Multiple fractures of pelvis with unstable disruption of pelvic ring, subsequent encounter for fracture with routine healing: Secondary | ICD-10-CM | POA: Diagnosis not present

## 2019-12-30 DIAGNOSIS — S32810D Multiple fractures of pelvis with stable disruption of pelvic ring, subsequent encounter for fracture with routine healing: Secondary | ICD-10-CM | POA: Diagnosis not present

## 2019-12-31 ENCOUNTER — Other Ambulatory Visit: Payer: Self-pay

## 2019-12-31 NOTE — Patient Outreach (Signed)
[facility]) Care Management  12/31/2019  Katherine Pope January 16, 1951 [id number]     Transition of Care Referral  Referral Date: 12/31/19  Referral Source: [facility] Discharge Report Date of Discharge: 3/38/32 Facility: [place]    Outreach attempt # 1 to patient. Spoke with patient who reported she was doing well since returning home. She denies any acute issues or concerns at present. She voices that she has very supportive daughter who is assisting her as needed. She had ortho MD follow up appt on yesterday. Patient is scheduled to get [facility] therapy and is awaiting for them to contact her. Patient's daughter called this morning to check on status of therapy beginning and awaiting a response back. Patient confirms she has all her meds in the home and no questions or concerns regarding them. She denies any RN CM/THN needs or concerns at this time.    Plan: RN CM will close case at this time.    Enzo Montgomery, RN,BSN,CCM [place] Management Telephonic Care Management Coordinator Direct Phone: [phone] Toll Free: [phone] Fax: [phone]

## 2020-01-01 DIAGNOSIS — S32810D Multiple fractures of pelvis with stable disruption of pelvic ring, subsequent encounter for fracture with routine healing: Secondary | ICD-10-CM | POA: Diagnosis not present

## 2020-01-01 DIAGNOSIS — M6281 Muscle weakness (generalized): Secondary | ICD-10-CM | POA: Diagnosis not present

## 2020-01-01 DIAGNOSIS — M6259 Muscle wasting and atrophy, not elsewhere classified, multiple sites: Secondary | ICD-10-CM | POA: Diagnosis not present

## 2020-01-04 DIAGNOSIS — S334XXD Traumatic rupture of symphysis pubis, subsequent encounter: Secondary | ICD-10-CM | POA: Diagnosis not present

## 2020-01-04 DIAGNOSIS — S81012D Laceration without foreign body, left knee, subsequent encounter: Secondary | ICD-10-CM | POA: Diagnosis not present

## 2020-01-04 DIAGNOSIS — E039 Hypothyroidism, unspecified: Secondary | ICD-10-CM | POA: Diagnosis not present

## 2020-01-04 DIAGNOSIS — F329 Major depressive disorder, single episode, unspecified: Secondary | ICD-10-CM | POA: Diagnosis not present

## 2020-01-04 DIAGNOSIS — S32810D Multiple fractures of pelvis with stable disruption of pelvic ring, subsequent encounter for fracture with routine healing: Secondary | ICD-10-CM | POA: Diagnosis not present

## 2020-01-04 DIAGNOSIS — F419 Anxiety disorder, unspecified: Secondary | ICD-10-CM | POA: Diagnosis not present

## 2020-01-04 DIAGNOSIS — S3141XD Laceration without foreign body of vagina and vulva, subsequent encounter: Secondary | ICD-10-CM | POA: Diagnosis not present

## 2020-01-04 DIAGNOSIS — E559 Vitamin D deficiency, unspecified: Secondary | ICD-10-CM | POA: Diagnosis not present

## 2020-01-04 DIAGNOSIS — Z87891 Personal history of nicotine dependence: Secondary | ICD-10-CM | POA: Diagnosis not present

## 2020-01-06 DIAGNOSIS — E039 Hypothyroidism, unspecified: Secondary | ICD-10-CM | POA: Diagnosis not present

## 2020-01-06 DIAGNOSIS — D692 Other nonthrombocytopenic purpura: Secondary | ICD-10-CM | POA: Diagnosis not present

## 2020-01-06 DIAGNOSIS — S329XXA Fracture of unspecified parts of lumbosacral spine and pelvis, initial encounter for closed fracture: Secondary | ICD-10-CM | POA: Diagnosis not present

## 2020-01-06 DIAGNOSIS — N390 Urinary tract infection, site not specified: Secondary | ICD-10-CM | POA: Diagnosis not present

## 2020-01-06 DIAGNOSIS — Z09 Encounter for follow-up examination after completed treatment for conditions other than malignant neoplasm: Secondary | ICD-10-CM | POA: Diagnosis not present

## 2020-01-07 DIAGNOSIS — Z87891 Personal history of nicotine dependence: Secondary | ICD-10-CM | POA: Diagnosis not present

## 2020-01-07 DIAGNOSIS — S3141XD Laceration without foreign body of vagina and vulva, subsequent encounter: Secondary | ICD-10-CM | POA: Diagnosis not present

## 2020-01-07 DIAGNOSIS — F419 Anxiety disorder, unspecified: Secondary | ICD-10-CM | POA: Diagnosis not present

## 2020-01-07 DIAGNOSIS — E039 Hypothyroidism, unspecified: Secondary | ICD-10-CM | POA: Diagnosis not present

## 2020-01-07 DIAGNOSIS — S81012D Laceration without foreign body, left knee, subsequent encounter: Secondary | ICD-10-CM | POA: Diagnosis not present

## 2020-01-07 DIAGNOSIS — S32810D Multiple fractures of pelvis with stable disruption of pelvic ring, subsequent encounter for fracture with routine healing: Secondary | ICD-10-CM | POA: Diagnosis not present

## 2020-01-07 DIAGNOSIS — S334XXD Traumatic rupture of symphysis pubis, subsequent encounter: Secondary | ICD-10-CM | POA: Diagnosis not present

## 2020-01-07 DIAGNOSIS — E559 Vitamin D deficiency, unspecified: Secondary | ICD-10-CM | POA: Diagnosis not present

## 2020-01-07 DIAGNOSIS — F329 Major depressive disorder, single episode, unspecified: Secondary | ICD-10-CM | POA: Diagnosis not present

## 2020-01-14 DIAGNOSIS — S32810D Multiple fractures of pelvis with stable disruption of pelvic ring, subsequent encounter for fracture with routine healing: Secondary | ICD-10-CM | POA: Diagnosis not present

## 2020-01-14 DIAGNOSIS — S334XXD Traumatic rupture of symphysis pubis, subsequent encounter: Secondary | ICD-10-CM | POA: Diagnosis not present

## 2020-01-14 DIAGNOSIS — F329 Major depressive disorder, single episode, unspecified: Secondary | ICD-10-CM | POA: Diagnosis not present

## 2020-01-14 DIAGNOSIS — F419 Anxiety disorder, unspecified: Secondary | ICD-10-CM | POA: Diagnosis not present

## 2020-01-14 DIAGNOSIS — S81012D Laceration without foreign body, left knee, subsequent encounter: Secondary | ICD-10-CM | POA: Diagnosis not present

## 2020-01-14 DIAGNOSIS — S3141XD Laceration without foreign body of vagina and vulva, subsequent encounter: Secondary | ICD-10-CM | POA: Diagnosis not present

## 2020-01-14 DIAGNOSIS — Z87891 Personal history of nicotine dependence: Secondary | ICD-10-CM | POA: Diagnosis not present

## 2020-01-14 DIAGNOSIS — E039 Hypothyroidism, unspecified: Secondary | ICD-10-CM | POA: Diagnosis not present

## 2020-01-14 DIAGNOSIS — E559 Vitamin D deficiency, unspecified: Secondary | ICD-10-CM | POA: Diagnosis not present

## 2020-01-22 DIAGNOSIS — F419 Anxiety disorder, unspecified: Secondary | ICD-10-CM | POA: Diagnosis not present

## 2020-01-22 DIAGNOSIS — S3141XD Laceration without foreign body of vagina and vulva, subsequent encounter: Secondary | ICD-10-CM | POA: Diagnosis not present

## 2020-01-22 DIAGNOSIS — E559 Vitamin D deficiency, unspecified: Secondary | ICD-10-CM | POA: Diagnosis not present

## 2020-01-22 DIAGNOSIS — F329 Major depressive disorder, single episode, unspecified: Secondary | ICD-10-CM | POA: Diagnosis not present

## 2020-01-22 DIAGNOSIS — S334XXD Traumatic rupture of symphysis pubis, subsequent encounter: Secondary | ICD-10-CM | POA: Diagnosis not present

## 2020-01-22 DIAGNOSIS — E039 Hypothyroidism, unspecified: Secondary | ICD-10-CM | POA: Diagnosis not present

## 2020-01-22 DIAGNOSIS — Z87891 Personal history of nicotine dependence: Secondary | ICD-10-CM | POA: Diagnosis not present

## 2020-01-22 DIAGNOSIS — S81012D Laceration without foreign body, left knee, subsequent encounter: Secondary | ICD-10-CM | POA: Diagnosis not present

## 2020-01-22 DIAGNOSIS — S32810D Multiple fractures of pelvis with stable disruption of pelvic ring, subsequent encounter for fracture with routine healing: Secondary | ICD-10-CM | POA: Diagnosis not present

## 2020-01-25 DIAGNOSIS — M6259 Muscle wasting and atrophy, not elsewhere classified, multiple sites: Secondary | ICD-10-CM | POA: Diagnosis not present

## 2020-01-25 DIAGNOSIS — M6281 Muscle weakness (generalized): Secondary | ICD-10-CM | POA: Diagnosis not present

## 2020-01-26 DIAGNOSIS — S334XXD Traumatic rupture of symphysis pubis, subsequent encounter: Secondary | ICD-10-CM | POA: Diagnosis not present

## 2020-01-26 DIAGNOSIS — E559 Vitamin D deficiency, unspecified: Secondary | ICD-10-CM | POA: Diagnosis not present

## 2020-01-26 DIAGNOSIS — S81012D Laceration without foreign body, left knee, subsequent encounter: Secondary | ICD-10-CM | POA: Diagnosis not present

## 2020-01-26 DIAGNOSIS — E039 Hypothyroidism, unspecified: Secondary | ICD-10-CM | POA: Diagnosis not present

## 2020-01-26 DIAGNOSIS — F419 Anxiety disorder, unspecified: Secondary | ICD-10-CM | POA: Diagnosis not present

## 2020-01-26 DIAGNOSIS — S3141XD Laceration without foreign body of vagina and vulva, subsequent encounter: Secondary | ICD-10-CM | POA: Diagnosis not present

## 2020-01-26 DIAGNOSIS — S32810D Multiple fractures of pelvis with stable disruption of pelvic ring, subsequent encounter for fracture with routine healing: Secondary | ICD-10-CM | POA: Diagnosis not present

## 2020-01-26 DIAGNOSIS — F329 Major depressive disorder, single episode, unspecified: Secondary | ICD-10-CM | POA: Diagnosis not present

## 2020-01-26 DIAGNOSIS — Z87891 Personal history of nicotine dependence: Secondary | ICD-10-CM | POA: Diagnosis not present

## 2020-01-27 DIAGNOSIS — S32811D Multiple fractures of pelvis with unstable disruption of pelvic ring, subsequent encounter for fracture with routine healing: Secondary | ICD-10-CM | POA: Diagnosis not present

## 2020-01-27 DIAGNOSIS — S32810D Multiple fractures of pelvis with stable disruption of pelvic ring, subsequent encounter for fracture with routine healing: Secondary | ICD-10-CM | POA: Diagnosis not present

## 2020-01-29 DIAGNOSIS — S334XXD Traumatic rupture of symphysis pubis, subsequent encounter: Secondary | ICD-10-CM | POA: Diagnosis not present

## 2020-01-29 DIAGNOSIS — S32810D Multiple fractures of pelvis with stable disruption of pelvic ring, subsequent encounter for fracture with routine healing: Secondary | ICD-10-CM | POA: Diagnosis not present

## 2020-01-29 DIAGNOSIS — E559 Vitamin D deficiency, unspecified: Secondary | ICD-10-CM | POA: Diagnosis not present

## 2020-01-29 DIAGNOSIS — S81012D Laceration without foreign body, left knee, subsequent encounter: Secondary | ICD-10-CM | POA: Diagnosis not present

## 2020-01-29 DIAGNOSIS — Z87891 Personal history of nicotine dependence: Secondary | ICD-10-CM | POA: Diagnosis not present

## 2020-01-29 DIAGNOSIS — F329 Major depressive disorder, single episode, unspecified: Secondary | ICD-10-CM | POA: Diagnosis not present

## 2020-01-29 DIAGNOSIS — E039 Hypothyroidism, unspecified: Secondary | ICD-10-CM | POA: Diagnosis not present

## 2020-01-29 DIAGNOSIS — F419 Anxiety disorder, unspecified: Secondary | ICD-10-CM | POA: Diagnosis not present

## 2020-01-29 DIAGNOSIS — S3141XD Laceration without foreign body of vagina and vulva, subsequent encounter: Secondary | ICD-10-CM | POA: Diagnosis not present

## 2020-02-02 DIAGNOSIS — E039 Hypothyroidism, unspecified: Secondary | ICD-10-CM | POA: Diagnosis not present

## 2020-02-02 DIAGNOSIS — F329 Major depressive disorder, single episode, unspecified: Secondary | ICD-10-CM | POA: Diagnosis not present

## 2020-02-02 DIAGNOSIS — S3141XD Laceration without foreign body of vagina and vulva, subsequent encounter: Secondary | ICD-10-CM | POA: Diagnosis not present

## 2020-02-02 DIAGNOSIS — S81012D Laceration without foreign body, left knee, subsequent encounter: Secondary | ICD-10-CM | POA: Diagnosis not present

## 2020-02-02 DIAGNOSIS — S334XXD Traumatic rupture of symphysis pubis, subsequent encounter: Secondary | ICD-10-CM | POA: Diagnosis not present

## 2020-02-02 DIAGNOSIS — F419 Anxiety disorder, unspecified: Secondary | ICD-10-CM | POA: Diagnosis not present

## 2020-02-02 DIAGNOSIS — Z87891 Personal history of nicotine dependence: Secondary | ICD-10-CM | POA: Diagnosis not present

## 2020-02-02 DIAGNOSIS — S32810D Multiple fractures of pelvis with stable disruption of pelvic ring, subsequent encounter for fracture with routine healing: Secondary | ICD-10-CM | POA: Diagnosis not present

## 2020-02-02 DIAGNOSIS — E559 Vitamin D deficiency, unspecified: Secondary | ICD-10-CM | POA: Diagnosis not present

## 2020-02-03 DIAGNOSIS — S334XXD Traumatic rupture of symphysis pubis, subsequent encounter: Secondary | ICD-10-CM | POA: Diagnosis not present

## 2020-02-03 DIAGNOSIS — E559 Vitamin D deficiency, unspecified: Secondary | ICD-10-CM | POA: Diagnosis not present

## 2020-02-03 DIAGNOSIS — S32810D Multiple fractures of pelvis with stable disruption of pelvic ring, subsequent encounter for fracture with routine healing: Secondary | ICD-10-CM | POA: Diagnosis not present

## 2020-02-03 DIAGNOSIS — E039 Hypothyroidism, unspecified: Secondary | ICD-10-CM | POA: Diagnosis not present

## 2020-02-03 DIAGNOSIS — F419 Anxiety disorder, unspecified: Secondary | ICD-10-CM | POA: Diagnosis not present

## 2020-02-03 DIAGNOSIS — S81012D Laceration without foreign body, left knee, subsequent encounter: Secondary | ICD-10-CM | POA: Diagnosis not present

## 2020-02-03 DIAGNOSIS — S3141XD Laceration without foreign body of vagina and vulva, subsequent encounter: Secondary | ICD-10-CM | POA: Diagnosis not present

## 2020-02-03 DIAGNOSIS — Z87891 Personal history of nicotine dependence: Secondary | ICD-10-CM | POA: Diagnosis not present

## 2020-02-03 DIAGNOSIS — F329 Major depressive disorder, single episode, unspecified: Secondary | ICD-10-CM | POA: Diagnosis not present

## 2020-02-09 DIAGNOSIS — S32810D Multiple fractures of pelvis with stable disruption of pelvic ring, subsequent encounter for fracture with routine healing: Secondary | ICD-10-CM | POA: Diagnosis not present

## 2020-02-09 DIAGNOSIS — Z87891 Personal history of nicotine dependence: Secondary | ICD-10-CM | POA: Diagnosis not present

## 2020-02-09 DIAGNOSIS — F419 Anxiety disorder, unspecified: Secondary | ICD-10-CM | POA: Diagnosis not present

## 2020-02-09 DIAGNOSIS — E039 Hypothyroidism, unspecified: Secondary | ICD-10-CM | POA: Diagnosis not present

## 2020-02-09 DIAGNOSIS — S81012D Laceration without foreign body, left knee, subsequent encounter: Secondary | ICD-10-CM | POA: Diagnosis not present

## 2020-02-09 DIAGNOSIS — S334XXD Traumatic rupture of symphysis pubis, subsequent encounter: Secondary | ICD-10-CM | POA: Diagnosis not present

## 2020-02-09 DIAGNOSIS — S3141XD Laceration without foreign body of vagina and vulva, subsequent encounter: Secondary | ICD-10-CM | POA: Diagnosis not present

## 2020-02-09 DIAGNOSIS — F329 Major depressive disorder, single episode, unspecified: Secondary | ICD-10-CM | POA: Diagnosis not present

## 2020-02-09 DIAGNOSIS — E559 Vitamin D deficiency, unspecified: Secondary | ICD-10-CM | POA: Diagnosis not present

## 2020-02-13 DIAGNOSIS — Z87891 Personal history of nicotine dependence: Secondary | ICD-10-CM | POA: Diagnosis not present

## 2020-02-13 DIAGNOSIS — F329 Major depressive disorder, single episode, unspecified: Secondary | ICD-10-CM | POA: Diagnosis not present

## 2020-02-13 DIAGNOSIS — S81012D Laceration without foreign body, left knee, subsequent encounter: Secondary | ICD-10-CM | POA: Diagnosis not present

## 2020-02-13 DIAGNOSIS — S3141XD Laceration without foreign body of vagina and vulva, subsequent encounter: Secondary | ICD-10-CM | POA: Diagnosis not present

## 2020-02-13 DIAGNOSIS — E559 Vitamin D deficiency, unspecified: Secondary | ICD-10-CM | POA: Diagnosis not present

## 2020-02-13 DIAGNOSIS — F419 Anxiety disorder, unspecified: Secondary | ICD-10-CM | POA: Diagnosis not present

## 2020-02-13 DIAGNOSIS — E039 Hypothyroidism, unspecified: Secondary | ICD-10-CM | POA: Diagnosis not present

## 2020-02-13 DIAGNOSIS — S32810D Multiple fractures of pelvis with stable disruption of pelvic ring, subsequent encounter for fracture with routine healing: Secondary | ICD-10-CM | POA: Diagnosis not present

## 2020-02-13 DIAGNOSIS — S334XXD Traumatic rupture of symphysis pubis, subsequent encounter: Secondary | ICD-10-CM | POA: Diagnosis not present

## 2020-02-17 DIAGNOSIS — E559 Vitamin D deficiency, unspecified: Secondary | ICD-10-CM | POA: Diagnosis not present

## 2020-02-17 DIAGNOSIS — E039 Hypothyroidism, unspecified: Secondary | ICD-10-CM | POA: Diagnosis not present

## 2020-02-17 DIAGNOSIS — S32810D Multiple fractures of pelvis with stable disruption of pelvic ring, subsequent encounter for fracture with routine healing: Secondary | ICD-10-CM | POA: Diagnosis not present

## 2020-02-17 DIAGNOSIS — Z87891 Personal history of nicotine dependence: Secondary | ICD-10-CM | POA: Diagnosis not present

## 2020-02-17 DIAGNOSIS — S3141XD Laceration without foreign body of vagina and vulva, subsequent encounter: Secondary | ICD-10-CM | POA: Diagnosis not present

## 2020-02-17 DIAGNOSIS — S81012D Laceration without foreign body, left knee, subsequent encounter: Secondary | ICD-10-CM | POA: Diagnosis not present

## 2020-02-17 DIAGNOSIS — F419 Anxiety disorder, unspecified: Secondary | ICD-10-CM | POA: Diagnosis not present

## 2020-02-17 DIAGNOSIS — F329 Major depressive disorder, single episode, unspecified: Secondary | ICD-10-CM | POA: Diagnosis not present

## 2020-02-17 DIAGNOSIS — S334XXD Traumatic rupture of symphysis pubis, subsequent encounter: Secondary | ICD-10-CM | POA: Diagnosis not present

## 2020-02-19 DIAGNOSIS — F329 Major depressive disorder, single episode, unspecified: Secondary | ICD-10-CM | POA: Diagnosis not present

## 2020-02-19 DIAGNOSIS — S81012D Laceration without foreign body, left knee, subsequent encounter: Secondary | ICD-10-CM | POA: Diagnosis not present

## 2020-02-19 DIAGNOSIS — S334XXD Traumatic rupture of symphysis pubis, subsequent encounter: Secondary | ICD-10-CM | POA: Diagnosis not present

## 2020-02-19 DIAGNOSIS — E039 Hypothyroidism, unspecified: Secondary | ICD-10-CM | POA: Diagnosis not present

## 2020-02-19 DIAGNOSIS — Z87891 Personal history of nicotine dependence: Secondary | ICD-10-CM | POA: Diagnosis not present

## 2020-02-19 DIAGNOSIS — F419 Anxiety disorder, unspecified: Secondary | ICD-10-CM | POA: Diagnosis not present

## 2020-02-19 DIAGNOSIS — S3141XD Laceration without foreign body of vagina and vulva, subsequent encounter: Secondary | ICD-10-CM | POA: Diagnosis not present

## 2020-02-19 DIAGNOSIS — S32810D Multiple fractures of pelvis with stable disruption of pelvic ring, subsequent encounter for fracture with routine healing: Secondary | ICD-10-CM | POA: Diagnosis not present

## 2020-02-19 DIAGNOSIS — E559 Vitamin D deficiency, unspecified: Secondary | ICD-10-CM | POA: Diagnosis not present

## 2020-02-24 DIAGNOSIS — S81012D Laceration without foreign body, left knee, subsequent encounter: Secondary | ICD-10-CM | POA: Diagnosis not present

## 2020-02-24 DIAGNOSIS — F329 Major depressive disorder, single episode, unspecified: Secondary | ICD-10-CM | POA: Diagnosis not present

## 2020-02-24 DIAGNOSIS — Z87891 Personal history of nicotine dependence: Secondary | ICD-10-CM | POA: Diagnosis not present

## 2020-02-24 DIAGNOSIS — S3141XD Laceration without foreign body of vagina and vulva, subsequent encounter: Secondary | ICD-10-CM | POA: Diagnosis not present

## 2020-02-24 DIAGNOSIS — S334XXD Traumatic rupture of symphysis pubis, subsequent encounter: Secondary | ICD-10-CM | POA: Diagnosis not present

## 2020-02-24 DIAGNOSIS — F419 Anxiety disorder, unspecified: Secondary | ICD-10-CM | POA: Diagnosis not present

## 2020-02-24 DIAGNOSIS — E559 Vitamin D deficiency, unspecified: Secondary | ICD-10-CM | POA: Diagnosis not present

## 2020-02-24 DIAGNOSIS — E039 Hypothyroidism, unspecified: Secondary | ICD-10-CM | POA: Diagnosis not present

## 2020-02-24 DIAGNOSIS — S32810D Multiple fractures of pelvis with stable disruption of pelvic ring, subsequent encounter for fracture with routine healing: Secondary | ICD-10-CM | POA: Diagnosis not present

## 2020-03-02 DIAGNOSIS — S32811D Multiple fractures of pelvis with unstable disruption of pelvic ring, subsequent encounter for fracture with routine healing: Secondary | ICD-10-CM | POA: Diagnosis not present

## 2020-03-11 DIAGNOSIS — S329XXA Fracture of unspecified parts of lumbosacral spine and pelvis, initial encounter for closed fracture: Secondary | ICD-10-CM | POA: Diagnosis not present

## 2020-03-11 DIAGNOSIS — R32 Unspecified urinary incontinence: Secondary | ICD-10-CM | POA: Diagnosis not present

## 2020-03-11 DIAGNOSIS — Z23 Encounter for immunization: Secondary | ICD-10-CM | POA: Diagnosis not present

## 2020-03-11 DIAGNOSIS — Z299 Encounter for prophylactic measures, unspecified: Secondary | ICD-10-CM | POA: Diagnosis not present

## 2020-03-11 DIAGNOSIS — Z713 Dietary counseling and surveillance: Secondary | ICD-10-CM | POA: Diagnosis not present

## 2020-03-11 DIAGNOSIS — E039 Hypothyroidism, unspecified: Secondary | ICD-10-CM | POA: Diagnosis not present

## 2020-03-23 DIAGNOSIS — E7800 Pure hypercholesterolemia, unspecified: Secondary | ICD-10-CM | POA: Diagnosis not present

## 2020-03-23 DIAGNOSIS — E039 Hypothyroidism, unspecified: Secondary | ICD-10-CM | POA: Diagnosis not present

## 2020-04-21 DIAGNOSIS — Z299 Encounter for prophylactic measures, unspecified: Secondary | ICD-10-CM | POA: Diagnosis not present

## 2020-04-21 DIAGNOSIS — R0789 Other chest pain: Secondary | ICD-10-CM | POA: Diagnosis not present

## 2020-04-21 DIAGNOSIS — E039 Hypothyroidism, unspecified: Secondary | ICD-10-CM | POA: Diagnosis not present

## 2020-04-21 DIAGNOSIS — R32 Unspecified urinary incontinence: Secondary | ICD-10-CM | POA: Diagnosis not present

## 2020-04-21 DIAGNOSIS — Z6832 Body mass index (BMI) 32.0-32.9, adult: Secondary | ICD-10-CM | POA: Diagnosis not present

## 2020-04-21 DIAGNOSIS — N390 Urinary tract infection, site not specified: Secondary | ICD-10-CM | POA: Diagnosis not present

## 2020-05-04 ENCOUNTER — Encounter: Payer: Self-pay | Admitting: Urology

## 2020-05-04 ENCOUNTER — Other Ambulatory Visit: Payer: Self-pay

## 2020-05-04 ENCOUNTER — Ambulatory Visit (INDEPENDENT_AMBULATORY_CARE_PROVIDER_SITE_OTHER): Payer: Medicare HMO | Admitting: Urology

## 2020-05-04 VITALS — BP 145/66 | HR 76 | Temp 98.2°F | Ht 65.5 in | Wt 190.0 lb

## 2020-05-04 DIAGNOSIS — R32 Unspecified urinary incontinence: Secondary | ICD-10-CM | POA: Diagnosis not present

## 2020-05-04 LAB — URINALYSIS, ROUTINE W REFLEX MICROSCOPIC
Bilirubin, UA: NEGATIVE
Glucose, UA: NEGATIVE
Ketones, UA: NEGATIVE
Leukocytes,UA: NEGATIVE
Nitrite, UA: NEGATIVE
Protein,UA: NEGATIVE
RBC, UA: NEGATIVE
Specific Gravity, UA: 1.015 (ref 1.005–1.030)
Urobilinogen, Ur: 0.2 mg/dL (ref 0.2–1.0)
pH, UA: 7 (ref 5.0–7.5)

## 2020-05-04 MED ORDER — MIRABEGRON ER 25 MG PO TB24: 25.0000 mg | ORAL_TABLET | Freq: Every day | ORAL | 0 refills | Status: DC

## 2020-05-04 NOTE — Progress Notes (Signed)
05/04/2020 2:16 PM   Katherine Pope Katherine Pope 08-12-1950 [id number]  Referring provider: Medicine, [facility] [place],  [place] [address]  Urinary incontinence  HPI: [name] is a 70yo here for evaluation of urinary incontinence. She had a open book pelvic fracture in 11/2019. No bladder injury was noted at the time of her pelvic fracture. Since discharge from the hospital she has had unaware large volume urinary incontinence. She soaks 2-3 pads per day and 1 at night. No issues with Bowel movements and no constipation. PVR 55cc.    PMH: Past Medical History:  Diagnosis Date  . Arthritis   . Chronic back pain   . Depression   . GERD (gastroesophageal reflux disease)   . Hypercholesteremia   . Hypothyroidism     Surgical History: Past Surgical History:  Procedure Laterality Date  . ABDOMINAL HYSTERECTOMY    . CHOLECYSTECTOMY    . ERCP N/A 12/16/2018   Procedure: ENDOSCOPIC RETROGRADE CHOLANGIOPANCREATOGRAPHY (ERCP);  Surgeon: Rogene Houston, MD;  [facility];  Service: Endoscopy;  Laterality: N/A;  . EXTERNAL FIXATION PELVIS  12/03/2019    EXTERNAL FIXATION PELVIS (N/A )  . EXTERNAL FIXATION PELVIS N/A 12/03/2019   Procedure: EXTERNAL FIXATION PELVIS;  Surgeon: Shona Needles, MD;  Location: [place];  Service: Orthopedics;  Laterality: N/A;  . LAPAROSCOPIC CHOLECYSTECTOMY  05/2016  . LITHOTRIPSY  12/16/2018   Procedure: LITHOTRIPSY WITH BASKET;  Surgeon: Rogene Houston, MD;  [facility];  Service: Endoscopy;;  . REMOVAL OF STONES N/A 12/16/2018   Procedure: REMOVAL OF STONES WITH BASKET AND STONE BALLOON EXTRACTOR;  Surgeon: Rogene Houston, MD;  [facility];  Service: Endoscopy;  Laterality: N/A;  . SPHINCTEROTOMY N/A 12/16/2018   Procedure: SPHINCTEROTOMY;  Surgeon: Rogene Houston, MD;  [facility];  Service: Endoscopy;  Laterality: N/A;    Home Medications:  Allergies as of 05/04/2020      Reactions   Morphine And Related  Shortness Of Breath   Morphine And Related    Oxygen level dropped.       Medication List       Accurate as of May 04, 2020  2:16 PM. If you have any questions, ask your nurse or doctor.        STOP taking these medications   enoxaparin 40 MG/0.4ML injection Commonly known as: LOVENOX Stopped by: Nicolette Bang, MD   ondansetron 4 MG tablet Commonly known as: ZOFRAN Stopped by: Nicolette Bang, MD   oxyCODONE 5 MG immediate release tablet Commonly known as: Oxy IR/ROXICODONE Stopped by: Nicolette Bang, MD     TAKE these medications   acetaminophen 500 MG tablet Commonly known as: TYLENOL Take 2 tablets (1,000 mg total) by mouth every 6 (six) hours.   calcium carbonate 500 MG chewable tablet Commonly known as: TUMS - dosed in mg elemental calcium Chew 1 tablet (200 mg of elemental calcium total) by mouth 3 (three) times daily with meals.   citalopram 20 MG tablet Commonly known as: CELEXA Take 20 mg by mouth daily. What changed: Another medication with the same name was removed. Continue taking this medication, and follow the directions you see here. Changed by: Nicolette Bang, MD   docusate sodium 100 MG capsule Commonly known as: COLACE Take 1 capsule (100 mg total) by mouth 2 (two) times daily.   gabapentin 100 MG capsule Commonly known as: NEURONTIN Take 1 capsule (100 mg total) by mouth 3 (three)  times daily.   levothyroxine 88 MCG tablet Commonly known as: SYNTHROID Take 88 mcg by mouth daily. What changed: Another medication with the same name was removed. Continue taking this medication, and follow the directions you see here. Changed by: Nicolette Bang, MD   LORazepam 0.5 MG tablet Commonly known as: ATIVAN Take 1-2 tablets (0.5-1 mg total) by mouth every 6 (six) hours as needed for anxiety or sleep.   methocarbamol 500 MG tablet Commonly known as: ROBAXIN Take 2 tablets (1,000 mg total) by mouth every 8 (eight) hours.   pantoprazole  40 MG tablet Commonly known as: PROTONIX Take 40 mg by mouth daily.   pantoprazole 40 MG tablet Commonly known as: PROTONIX Take 40 mg by mouth daily.   polyethylene glycol 17 g packet Commonly known as: MIRALAX / GLYCOLAX Take 17 g by mouth daily as needed for mild constipation.   Vitamin D3 25 MCG tablet Commonly known as: Vitamin D Take 2 tablets (2,000 Units total) by mouth daily.       Allergies:  Allergies  Allergen Reactions  . Morphine And Related Shortness Of Breath  . Morphine And Related     Oxygen level dropped.     Family History: Family History  Problem Relation Age of Onset  . COPD Mother   . Lung cancer Father   . Colon cancer Neg Hx   . Colon polyps Neg Hx     Social History:  reports that she quit smoking about 24 years ago. Her smoking use included cigarettes. She has a 20.00 pack-year smoking history. She has never used smokeless tobacco. She reports that she does not drink alcohol and does not use drugs.  ROS: All other review of systems were reviewed and are negative except what is noted above in HPI  Physical Exam: BP (!) 145/66   Pulse 76   Temp 98.2 F (36.8 C)   Ht 5' 5.5" (1.664 m)   Wt 190 lb (86.2 kg)   BMI 31.14 kg/m   Constitutional:  Alert and oriented, No acute distress. HEENT: [place] AT, moist mucus membranes.  Trachea midline, no masses. Cardiovascular: No clubbing, cyanosis, or edema. Respiratory: Normal respiratory effort, no increased work of breathing. GI: Abdomen is soft, nontender, nondistended, no abdominal masses GU: No CVA tenderness.  Lymph: No cervical or inguinal lymphadenopathy. Skin: No rashes, bruises or suspicious lesions. Neurologic: Grossly intact, no focal deficits, moving all 4 extremities. Psychiatric: Normal mood and affect.  Laboratory Data: Lab Results  Component Value Date   WBC 5.0 12/07/2019   HGB 8.3 (L) 12/07/2019   HCT 26.1 (L) 12/07/2019   MCV 96.0 12/07/2019   PLT 166 12/07/2019     Lab Results  Component Value Date   CREATININE 0.68 12/10/2019    No results found for: PSA  No results found for: TESTOSTERONE  No results found for: HGBA1C  Urinalysis    Component Value Date/Time   COLORURINE YELLOW 12/03/2019 [place] 12/03/2019 1649   LABSPEC >1.030 (H) 12/03/2019 1649   PHURINE 5.0 12/03/2019 1649   GLUCOSEU NEGATIVE 12/03/2019 1649   HGBUR LARGE (A) 12/03/2019 1649   BILIRUBINUR SMALL (A) 12/03/2019 1649   KETONESUR NEGATIVE 12/03/2019 1649   PROTEINUR NEGATIVE 12/03/2019 1649   NITRITE NEGATIVE 12/03/2019 1649   LEUKOCYTESUR NEGATIVE 12/03/2019 1649    Lab Results  Component Value Date   BACTERIA FEW (A) 12/03/2019    Pertinent Imaging: CT 11/2019: Images reviewed and discussed with the patient No  results found for this or any previous visit.  No results found for this or any previous visit.  No results found for this or any previous visit.  No results found for this or any previous visit.  No results found for this or any previous visit.  No results found for this or any previous visit.  No results found for this or any previous visit.  No results found for this or any previous visit.   Assessment & Plan:    1. Urinary incontinence, unspecified type -We will trial mirabegron 25mg   - Urinalysis, Routine w reflex microscopic - Bladder Scan (Post Void Residual) in office   No follow-ups on file.  Nicolette Bang, MD  [facility] [place]

## 2020-05-04 NOTE — Patient Instructions (Signed)
Overactive Bladder, Adult  Overactive bladder is a condition in which a person has a sudden and frequent need to urinate. A person might also leak urine if he or she cannot get to the bathroom fast enough (urinary incontinence). Sometimes, symptoms can interfere with work or social activities. What are the causes? Overactive bladder is associated with poor nerve signals between your bladder and your brain. Your bladder may get the signal to empty before it is full. You may also have very sensitive muscles that make your bladder squeeze too soon. This condition may also be caused by other factors, such as:  Medical conditions: ? Urinary tract infection. ? Infection of nearby tissues. ? Prostate enlargement. ? Bladder stones, inflammation, or tumors. ? Diabetes. ? Muscle or nerve weakness, especially from these conditions:  A spinal cord injury.  Stroke.  Multiple sclerosis.  Parkinson's disease.  Other causes: ? Surgery on the uterus or urethra. ? Drinking too much caffeine or alcohol. ? Certain medicines, especially those that eliminate extra fluid in the body (diuretics). ? Constipation. What increases the risk? You may be at greater risk for overactive bladder if you:  Are an older adult.  Smoke.  Are going through menopause.  Have prostate problems.  Have a neurological disease, such as stroke, dementia, Parkinson's disease, or multiple sclerosis (MS).  Eat or drink alcohol, spicy food, caffeine, and other things that irritate the bladder.  Are overweight or obese. What are the signs or symptoms? Symptoms of this condition include a sudden, strong urge to urinate. Other symptoms include:  Leaking urine.  Urinating 8 or more times a day.  Waking up to urinate 2 or more times overnight. How is this diagnosed? This condition may be diagnosed based on:  Your symptoms and medical history.  A physical exam.  Blood or urine tests to check for possible causes,  such as infection. You may also need to see a health care provider who specializes in urinary tract problems. This is called a urologist. How is this treated? Treatment for overactive bladder depends on the cause of your condition and whether it is mild or severe. Treatment may include:  Bladder training, such as: ? Learning to control the urge to urinate by following a schedule to urinate at regular intervals. ? Doing Kegel exercises to strengthen the pelvic floor muscles that support your bladder.  Special devices, such as: ? Biofeedback. This uses sensors to help you become aware of your body's signals. ? Electrical stimulation. This uses electrodes placed inside the body (implanted) or outside the body. These electrodes send gentle pulses of electricity to strengthen the nerves or muscles that control the bladder. ? Women may use a plastic device, called a pessary, that fits into the vagina and supports the bladder.  Medicines, such as: ? Antibiotics to treat bladder infection. ? Antispasmodics to stop the bladder from releasing urine at the wrong time. ? Tricyclic antidepressants to relax bladder muscles. ? Injections of botulinum toxin type A directly into the bladder tissue to relax bladder muscles.  Surgery, such as: ? A device may be implanted to help manage the nerve signals that control urination. ? An electrode may be implanted to stimulate electrical signals in the bladder. ? A procedure may be done to change the shape of the bladder. This is done only in very severe cases. Follow these instructions at home: Eating and drinking  Make diet or lifestyle changes recommended by your health care provider. These may include: ? Drinking fluids   throughout the day and not only with meals. ? Cutting down on caffeine or alcohol. ? Eating a healthy and balanced diet to prevent constipation. This may include:  Choosing foods that are high in fiber, such as beans, whole grains, and  fresh fruits and vegetables.  Limiting foods that are high in fat and processed sugars, such as fried and sweet foods.   Lifestyle  Lose weight if needed.  Do not use any products that contain nicotine or tobacco. These include cigarettes, chewing tobacco, and vaping devices, such as e-cigarettes. If you need help quitting, ask your health care provider.   General instructions  Take over-the-counter and prescription medicines only as told by your health care provider.  If you were prescribed an antibiotic medicine, take it as told by your health care provider. Do not stop taking the antibiotic even if you start to feel better.  Use any implants or pessary as told by your health care provider.  If needed, wear pads to absorb urine leakage.  Keep a log to track how much and when you drink, and when you need to urinate. This will help your health care provider monitor your condition.  Keep all follow-up visits. This is important. Contact a health care provider if:  You have a fever or chills.  Your symptoms do not get better with treatment.  Your pain and discomfort get worse.  You have more frequent urges to urinate. Get help right away if:  You are not able to control your bladder. Summary  Overactive bladder refers to a condition in which a person has a sudden and frequent need to urinate.  Several conditions may lead to an overactive bladder.  Treatment for overactive bladder depends on the cause and severity of your condition.  Making lifestyle changes, doing Kegel exercises, keeping a log, and taking medicines can help with this condition. This information is not intended to replace advice given to you by your health care provider. Make sure you discuss any questions you have with your health care provider. Document Revised: 12/29/2019 Document Reviewed: 12/29/2019 Elsevier Patient Education  2021 Elsevier Inc.  

## 2020-05-04 NOTE — Progress Notes (Signed)
Bladder Scan Patient can void: 55 ml Performed By: Hope,lpn    Urological Symptom Review  Patient is experiencing the following symptoms: Frequent urination Hard to postpone urination Leakage of urine Injury to kidneys/bladder Weak stream   Review of Systems  Gastrointestinal (upper)  : Negative for upper GI symptoms  Gastrointestinal (lower) : Negative for lower GI symptoms  Constitutional : Negative for symptoms  Skin: Negative for skin symptoms  Eyes: Negative for eye symptoms  Ear/Nose/Throat : Negative for Ear/Nose/Throat symptoms  Hematologic/Lymphatic: Negative for Hematologic/Lymphatic symptoms  Cardiovascular : Negative for cardiovascular symptoms  Respiratory : Negative for respiratory symptoms  Endocrine: Negative for endocrine symptoms  Musculoskeletal: Negative for musculoskeletal symptoms  Neurological: Negative for neurological symptoms  Psychologic: Negative for psychiatric symptoms

## 2020-06-01 DIAGNOSIS — S32811D Multiple fractures of pelvis with unstable disruption of pelvic ring, subsequent encounter for fracture with routine healing: Secondary | ICD-10-CM | POA: Diagnosis not present

## 2020-06-21 DIAGNOSIS — E039 Hypothyroidism, unspecified: Secondary | ICD-10-CM | POA: Diagnosis not present

## 2020-06-21 DIAGNOSIS — E7800 Pure hypercholesterolemia, unspecified: Secondary | ICD-10-CM | POA: Diagnosis not present

## 2020-06-23 ENCOUNTER — Other Ambulatory Visit: Payer: Self-pay

## 2020-06-23 ENCOUNTER — Ambulatory Visit (INDEPENDENT_AMBULATORY_CARE_PROVIDER_SITE_OTHER): Payer: Medicare HMO | Admitting: Urology

## 2020-06-23 VITALS — BP 143/77 | HR 89 | Temp 98.4°F | Ht 65.0 in | Wt 180.0 lb

## 2020-06-23 DIAGNOSIS — R32 Unspecified urinary incontinence: Secondary | ICD-10-CM | POA: Diagnosis not present

## 2020-06-23 LAB — URINALYSIS, ROUTINE W REFLEX MICROSCOPIC
Bilirubin, UA: NEGATIVE
Glucose, UA: NEGATIVE
Ketones, UA: NEGATIVE
Leukocytes,UA: NEGATIVE
Nitrite, UA: NEGATIVE
Protein,UA: NEGATIVE
RBC, UA: NEGATIVE
Specific Gravity, UA: <1.005 — ABNORMAL LOW (ref 1.005–1.030)
Urobilinogen, Ur: 0.2 mg/dL (ref 0.2–1.0)
pH, UA: 5 (ref 5.0–7.5)

## 2020-06-23 LAB — BLADDER SCAN AMB NON-IMAGING: Scan Result: 73

## 2020-06-23 MED ORDER — FESOTERODINE FUMARATE ER 8 MG PO TB24: 8.0000 mg | ORAL_TABLET | Freq: Every day | ORAL | 0 refills | Status: DC

## 2020-06-23 MED ORDER — CIPROFLOXACIN HCL 500 MG PO TABS
500.0000 mg | ORAL_TABLET | Freq: Once | ORAL | Status: AC
  Administered 2020-06-23: 500 mg via ORAL

## 2020-06-23 NOTE — Progress Notes (Signed)
06/23/2020 2:21 PM   Katherine Pope Feb 14, 1951 [id number]  Referring provider: Medicine, [facility] [place],  Katherine Pope [id number]   urinary incontinence  HPI: Katherine Pope is a 70yo here for followup for urinary incontinence. Last visit we started mirabegron which failed to improve her urinary incontinence. She continues to soak 2-3 pads per day and at least 1 pad at night. She has urinary urgency and frequency every 1-2 hours. She denies any hematuria or dysuria. No other complaints today. She has a hx of pelvic fracture and repair.   PMH: Past Medical History:  Diagnosis Date  . Arthritis   . Chronic back pain   . Depression   . GERD (gastroesophageal reflux disease)   . Hypercholesteremia   . Hypothyroidism     Surgical History: Past Surgical History:  Procedure Laterality Date  . ABDOMINAL HYSTERECTOMY    . CHOLECYSTECTOMY    . ERCP N/A 12/16/2018   Procedure: ENDOSCOPIC RETROGRADE CHOLANGIOPANCREATOGRAPHY (ERCP);  Surgeon: Rogene Houston, MD;  [facility];  Service: Endoscopy;  Laterality: N/A;  . EXTERNAL FIXATION PELVIS  12/03/2019    EXTERNAL FIXATION PELVIS (N/A )  . EXTERNAL FIXATION PELVIS N/A 12/03/2019   Procedure: EXTERNAL FIXATION PELVIS;  Surgeon: Shona Needles, MD;  Location: [place];  Service: Orthopedics;  Laterality: N/A;  . LAPAROSCOPIC CHOLECYSTECTOMY  05/2016  . LITHOTRIPSY  12/16/2018   Procedure: LITHOTRIPSY WITH BASKET;  Surgeon: Rogene Houston, MD;  [facility];  Service: Endoscopy;;  . REMOVAL OF STONES N/A 12/16/2018   Procedure: REMOVAL OF STONES WITH BASKET AND STONE BALLOON EXTRACTOR;  Surgeon: Rogene Houston, MD;  [facility];  Service: Endoscopy;  Laterality: N/A;  . SPHINCTEROTOMY N/A 12/16/2018   Procedure: SPHINCTEROTOMY;  Surgeon: Rogene Houston, MD;  [facility];  Service: Endoscopy;  Laterality: N/A;    Home Medications:  Allergies as of 06/23/2020      Reactions   Morphine And  Related Shortness Of Breath   Morphine And Related    Oxygen level dropped.       Medication List       Accurate as of June 23, 2020  2:21 PM. If you have any questions, ask your nurse or doctor.        acetaminophen 500 MG tablet Commonly known as: TYLENOL Take 2 tablets (1,000 mg total) by mouth every 6 (six) hours.   calcium carbonate 500 MG chewable tablet Commonly known as: TUMS - dosed in mg elemental calcium Chew 1 tablet (200 mg of elemental calcium total) by mouth 3 (three) times daily with meals.   citalopram 20 MG tablet Commonly known as: CELEXA Take 20 mg by mouth daily.   docusate sodium 100 MG capsule Commonly known as: COLACE Take 1 capsule (100 mg total) by mouth 2 (two) times daily.   gabapentin 100 MG capsule Commonly known as: NEURONTIN Take 1 capsule (100 mg total) by mouth 3 (three) times daily.   levothyroxine 88 MCG tablet Commonly known as: SYNTHROID Take 88 mcg by mouth daily.   LORazepam 0.5 MG tablet Commonly known as: ATIVAN Take 1-2 tablets (0.5-1 mg total) by mouth every 6 (six) hours as needed for anxiety or sleep.   methocarbamol 500 MG tablet Commonly known as: ROBAXIN Take 2 tablets (1,000 mg total) by mouth every 8 (eight) hours.   mirabegron ER 25 MG Tb24 tablet Commonly known as: MYRBETRIQ Take 1 tablet (25 mg total)  by mouth daily.   pantoprazole 40 MG tablet Commonly known as: PROTONIX Take 40 mg by mouth daily.   pantoprazole 40 MG tablet Commonly known as: PROTONIX Take 40 mg by mouth daily.   polyethylene glycol 17 g packet Commonly known as: MIRALAX / GLYCOLAX Take 17 g by mouth daily as needed for mild constipation.   Vitamin D3 25 MCG tablet Commonly known as: Vitamin D Take 2 tablets (2,000 Units total) by mouth daily.       Allergies:  Allergies  Allergen Reactions  . Morphine And Related Shortness Of Breath  . Morphine And Related     Oxygen level dropped.     Family History: Family History   Problem Relation Age of Onset  . COPD Mother   . Lung cancer Father   . Colon cancer Neg Hx   . Colon polyps Neg Hx     Social History:  reports that she quit smoking about 24 years ago. Her smoking use included cigarettes. She has a 20.00 pack-year smoking history. She has never used smokeless tobacco. She reports that she does not drink alcohol and does not use drugs.  ROS: All other review of systems were reviewed and are negative except what is noted above in HPI  Physical Exam: BP (!) 143/77   Pulse 89   Temp 98.4 F (36.9 C)   Ht 5\' 5"  (1.651 m)   Wt 180 lb (81.6 kg)   BMI 29.95 kg/m   Constitutional:  Alert and oriented, No acute distress. HEENT: Katherine Pope AT, moist mucus membranes.  Trachea midline, no masses. Cardiovascular: No clubbing, cyanosis, or edema. Respiratory: Normal respiratory effort, no increased work of breathing. GI: Abdomen is soft, nontender, nondistended, no abdominal masses GU: No CVA tenderness.  Lymph: No cervical or inguinal lymphadenopathy. Skin: No rashes, bruises or suspicious lesions. Neurologic: Grossly intact, no focal deficits, moving all 4 extremities. Psychiatric: Normal mood and affect.  Laboratory Data: Lab Results  Component Value Date   WBC 5.0 12/07/2019   HGB 8.3 (L) 12/07/2019   HCT 26.1 (L) 12/07/2019   MCV 96.0 12/07/2019   PLT 166 12/07/2019    Lab Results  Component Value Date   CREATININE 0.68 12/10/2019    No results found for: PSA  No results found for: TESTOSTERONE  No results found for: HGBA1C  Urinalysis    Component Value Date/Time   COLORURINE YELLOW 12/03/2019 1649   APPEARANCEUR Clear 05/04/2020 1342   LABSPEC >1.030 (H) 12/03/2019 1649   PHURINE 5.0 12/03/2019 1649   GLUCOSEU Negative 05/04/2020 1342   HGBUR LARGE (A) 12/03/2019 1649   BILIRUBINUR Negative 05/04/2020 [place] 12/03/2019 1649   PROTEINUR Negative 05/04/2020 1342   PROTEINUR NEGATIVE 12/03/2019 1649   NITRITE  Negative 05/04/2020 1342   NITRITE NEGATIVE 12/03/2019 1649   LEUKOCYTESUR Negative 05/04/2020 1342   LEUKOCYTESUR NEGATIVE 12/03/2019 1649    Lab Results  Component Value Date   LABMICR Comment 05/04/2020   BACTERIA FEW (A) 12/03/2019    Pertinent Imaging:  No results found for this or any previous visit.  No results found for this or any previous visit.  No results found for this or any previous visit.  No results found for this or any previous visit.  No results found for this or any previous visit.  No results found for this or any previous visit.  No results found for this or any previous visit.  No results found for this or any previous  visit.   Assessment & Plan:    1. Urinary incontinence, unspecified type -toviaz 8mg  daily - Urinalysis, Routine w reflex microscopic - BLADDER SCAN AMB NON-IMAGING   No follow-ups on file.  Nicolette Bang, MD  [facility] [place]       Cystoscopy Procedure Note  Patient identification was confirmed, informed consent was obtained, and patient was prepped using Betadine solution.  Lidocaine jelly was administered per urethral meatus.    Procedure: - Flexible cystoscope introduced, without any difficulty.   - Thorough search of the bladder revealed:    normal urethral meatus    normal urothelium    no stones    no ulcers     no tumors    no urethral polyps    no trabeculation  - Ureteral orifices were normal in position and appearance.  Post-Procedure: - Patient tolerated the procedure well

## 2020-06-23 NOTE — Progress Notes (Signed)
Bladder Scan Patient can void: 73 ml Performed By: Gage Weant,lpn   Urological Symptom Review  Patient is experiencing the following symptoms: Frequent urination Hard to postpone urination Leakage of urine   Review of Systems  Gastrointestinal (upper)  : Indigestion/heartburn  Gastrointestinal (lower) : Negative for lower GI symptoms  Constitutional : Negative for symptoms  Skin: Negative for skin symptoms  Eyes: Negative for eye symptoms  Ear/Nose/Throat : Negative for Ear/Nose/Throat symptoms  Hematologic/Lymphatic: Negative for Hematologic/Lymphatic symptoms  Cardiovascular : Negative for cardiovascular symptoms  Respiratory : Negative for respiratory symptoms  Endocrine: Negative for endocrine symptoms  Musculoskeletal: Back pain  Neurological: Negative for neurological symptoms  Psychologic: Depression Anxiety

## 2020-06-29 ENCOUNTER — Encounter: Payer: Self-pay | Admitting: Urology

## 2020-06-29 NOTE — Patient Instructions (Signed)
Overactive Bladder, Adult  Overactive bladder is a condition in which a person has a sudden and frequent need to urinate. A person might also leak urine if he or she cannot get to the bathroom fast enough (urinary incontinence). Sometimes, symptoms can interfere with work or social activities. What are the causes? Overactive bladder is associated with poor nerve signals between your bladder and your brain. Your bladder may get the signal to empty before it is full. You may also have very sensitive muscles that make your bladder squeeze too soon. This condition may also be caused by other factors, such as:  Medical conditions: ? Urinary tract infection. ? Infection of nearby tissues. ? Prostate enlargement. ? Bladder stones, inflammation, or tumors. ? Diabetes. ? Muscle or nerve weakness, especially from these conditions:  A spinal cord injury.  Stroke.  Multiple sclerosis.  Parkinson's disease.  Other causes: ? Surgery on the uterus or urethra. ? Drinking too much caffeine or alcohol. ? Certain medicines, especially those that eliminate extra fluid in the body (diuretics). ? Constipation. What increases the risk? You may be at greater risk for overactive bladder if you:  Are an older adult.  Smoke.  Are going through menopause.  Have prostate problems.  Have a neurological disease, such as stroke, dementia, Parkinson's disease, or multiple sclerosis (MS).  Eat or drink alcohol, spicy food, caffeine, and other things that irritate the bladder.  Are overweight or obese. What are the signs or symptoms? Symptoms of this condition include a sudden, strong urge to urinate. Other symptoms include:  Leaking urine.  Urinating 8 or more times a day.  Waking up to urinate 2 or more times overnight. How is this diagnosed? This condition may be diagnosed based on:  Your symptoms and medical history.  A physical exam.  Blood or urine tests to check for possible causes,  such as infection. You may also need to see a health care provider who specializes in urinary tract problems. This is called a urologist. How is this treated? Treatment for overactive bladder depends on the cause of your condition and whether it is mild or severe. Treatment may include:  Bladder training, such as: ? Learning to control the urge to urinate by following a schedule to urinate at regular intervals. ? Doing Kegel exercises to strengthen the pelvic floor muscles that support your bladder.  Special devices, such as: ? Biofeedback. This uses sensors to help you become aware of your body's signals. ? Electrical stimulation. This uses electrodes placed inside the body (implanted) or outside the body. These electrodes send gentle pulses of electricity to strengthen the nerves or muscles that control the bladder. ? Women may use a plastic device, called a pessary, that fits into the vagina and supports the bladder.  Medicines, such as: ? Antibiotics to treat bladder infection. ? Antispasmodics to stop the bladder from releasing urine at the wrong time. ? Tricyclic antidepressants to relax bladder muscles. ? Injections of botulinum toxin type A directly into the bladder tissue to relax bladder muscles.  Surgery, such as: ? A device may be implanted to help manage the nerve signals that control urination. ? An electrode may be implanted to stimulate electrical signals in the bladder. ? A procedure may be done to change the shape of the bladder. This is done only in very severe cases. Follow these instructions at home: Eating and drinking  Make diet or lifestyle changes recommended by your health care provider. These may include: ? Drinking fluids   throughout the day and not only with meals. ? Cutting down on caffeine or alcohol. ? Eating a healthy and balanced diet to prevent constipation. This may include:  Choosing foods that are high in fiber, such as beans, whole grains, and  fresh fruits and vegetables.  Limiting foods that are high in fat and processed sugars, such as fried and sweet foods.   Lifestyle  Lose weight if needed.  Do not use any products that contain nicotine or tobacco. These include cigarettes, chewing tobacco, and vaping devices, such as e-cigarettes. If you need help quitting, ask your health care provider.   General instructions  Take over-the-counter and prescription medicines only as told by your health care provider.  If you were prescribed an antibiotic medicine, take it as told by your health care provider. Do not stop taking the antibiotic even if you start to feel better.  Use any implants or pessary as told by your health care provider.  If needed, wear pads to absorb urine leakage.  Keep a log to track how much and when you drink, and when you need to urinate. This will help your health care provider monitor your condition.  Keep all follow-up visits. This is important. Contact a health care provider if:  You have a fever or chills.  Your symptoms do not get better with treatment.  Your pain and discomfort get worse.  You have more frequent urges to urinate. Get help right away if:  You are not able to control your bladder. Summary  Overactive bladder refers to a condition in which a person has a sudden and frequent need to urinate.  Several conditions may lead to an overactive bladder.  Treatment for overactive bladder depends on the cause and severity of your condition.  Making lifestyle changes, doing Kegel exercises, keeping a log, and taking medicines can help with this condition. This information is not intended to replace advice given to you by your health care provider. Make sure you discuss any questions you have with your health care provider. Document Revised: 12/29/2019 Document Reviewed: 12/29/2019 Elsevier Patient Education  2021 Elsevier Inc.  

## 2020-07-23 ENCOUNTER — Ambulatory Visit (INDEPENDENT_AMBULATORY_CARE_PROVIDER_SITE_OTHER): Payer: Medicare HMO | Admitting: Urology

## 2020-07-23 ENCOUNTER — Encounter: Payer: Self-pay | Admitting: Urology

## 2020-07-23 ENCOUNTER — Other Ambulatory Visit: Payer: Self-pay

## 2020-07-23 VITALS — BP 153/74 | HR 87 | Temp 98.7°F | Ht 65.0 in | Wt 195.8 lb

## 2020-07-23 DIAGNOSIS — R32 Unspecified urinary incontinence: Secondary | ICD-10-CM | POA: Diagnosis not present

## 2020-07-23 LAB — URINALYSIS, ROUTINE W REFLEX MICROSCOPIC
Bilirubin, UA: NEGATIVE
Glucose, UA: NEGATIVE
Leukocytes,UA: NEGATIVE
Nitrite, UA: NEGATIVE
Protein,UA: NEGATIVE
Specific Gravity, UA: 1.025 (ref 1.005–1.030)
Urobilinogen, Ur: 0.2 mg/dL (ref 0.2–1.0)
pH, UA: 5.5 (ref 5.0–7.5)

## 2020-07-23 LAB — MICROSCOPIC EXAMINATION: WBC, UA: NONE SEEN /hpf (ref 0–5)

## 2020-07-23 MED ORDER — FESOTERODINE FUMARATE ER 8 MG PO TB24: 8.0000 mg | ORAL_TABLET | Freq: Every day | ORAL | 11 refills | Status: DC

## 2020-07-23 NOTE — Patient Instructions (Signed)
Overactive Bladder, Adult  Overactive bladder is a condition in which a person has a sudden and frequent need to urinate. A person might also leak urine if he or she cannot get to the bathroom fast enough (urinary incontinence). Sometimes, symptoms can interfere with work or social activities. What are the causes? Overactive bladder is associated with poor nerve signals between your bladder and your brain. Your bladder may get the signal to empty before it is full. You may also have very sensitive muscles that make your bladder squeeze too soon. This condition may also be caused by other factors, such as:  Medical conditions: ? Urinary tract infection. ? Infection of nearby tissues. ? Prostate enlargement. ? Bladder stones, inflammation, or tumors. ? Diabetes. ? Muscle or nerve weakness, especially from these conditions:  A spinal cord injury.  Stroke.  Multiple sclerosis.  Parkinson's disease.  Other causes: ? Surgery on the uterus or urethra. ? Drinking too much caffeine or alcohol. ? Certain medicines, especially those that eliminate extra fluid in the body (diuretics). ? Constipation. What increases the risk? You may be at greater risk for overactive bladder if you:  Are an older adult.  Smoke.  Are going through menopause.  Have prostate problems.  Have a neurological disease, such as stroke, dementia, Parkinson's disease, or multiple sclerosis (MS).  Eat or drink alcohol, spicy food, caffeine, and other things that irritate the bladder.  Are overweight or obese. What are the signs or symptoms? Symptoms of this condition include a sudden, strong urge to urinate. Other symptoms include:  Leaking urine.  Urinating 8 or more times a day.  Waking up to urinate 2 or more times overnight. How is this diagnosed? This condition may be diagnosed based on:  Your symptoms and medical history.  A physical exam.  Blood or urine tests to check for possible causes,  such as infection. You may also need to see a health care provider who specializes in urinary tract problems. This is called a urologist. How is this treated? Treatment for overactive bladder depends on the cause of your condition and whether it is mild or severe. Treatment may include:  Bladder training, such as: ? Learning to control the urge to urinate by following a schedule to urinate at regular intervals. ? Doing Kegel exercises to strengthen the pelvic floor muscles that support your bladder.  Special devices, such as: ? Biofeedback. This uses sensors to help you become aware of your body's signals. ? Electrical stimulation. This uses electrodes placed inside the body (implanted) or outside the body. These electrodes send gentle pulses of electricity to strengthen the nerves or muscles that control the bladder. ? Women may use a plastic device, called a pessary, that fits into the vagina and supports the bladder.  Medicines, such as: ? Antibiotics to treat bladder infection. ? Antispasmodics to stop the bladder from releasing urine at the wrong time. ? Tricyclic antidepressants to relax bladder muscles. ? Injections of botulinum toxin type A directly into the bladder tissue to relax bladder muscles.  Surgery, such as: ? A device may be implanted to help manage the nerve signals that control urination. ? An electrode may be implanted to stimulate electrical signals in the bladder. ? A procedure may be done to change the shape of the bladder. This is done only in very severe cases. Follow these instructions at home: Eating and drinking  Make diet or lifestyle changes recommended by your health care provider. These may include: ? Drinking fluids   throughout the day and not only with meals. ? Cutting down on caffeine or alcohol. ? Eating a healthy and balanced diet to prevent constipation. This may include:  Choosing foods that are high in fiber, such as beans, whole grains, and  fresh fruits and vegetables.  Limiting foods that are high in fat and processed sugars, such as fried and sweet foods.   Lifestyle  Lose weight if needed.  Do not use any products that contain nicotine or tobacco. These include cigarettes, chewing tobacco, and vaping devices, such as e-cigarettes. If you need help quitting, ask your health care provider.   General instructions  Take over-the-counter and prescription medicines only as told by your health care provider.  If you were prescribed an antibiotic medicine, take it as told by your health care provider. Do not stop taking the antibiotic even if you start to feel better.  Use any implants or pessary as told by your health care provider.  If needed, wear pads to absorb urine leakage.  Keep a log to track how much and when you drink, and when you need to urinate. This will help your health care provider monitor your condition.  Keep all follow-up visits. This is important. Contact a health care provider if:  You have a fever or chills.  Your symptoms do not get better with treatment.  Your pain and discomfort get worse.  You have more frequent urges to urinate. Get help right away if:  You are not able to control your bladder. Summary  Overactive bladder refers to a condition in which a person has a sudden and frequent need to urinate.  Several conditions may lead to an overactive bladder.  Treatment for overactive bladder depends on the cause and severity of your condition.  Making lifestyle changes, doing Kegel exercises, keeping a log, and taking medicines can help with this condition. This information is not intended to replace advice given to you by your health care provider. Make sure you discuss any questions you have with your health care provider. Document Revised: 12/29/2019 Document Reviewed: 12/29/2019 Elsevier Patient Education  2021 Elsevier Inc.  

## 2020-07-23 NOTE — Progress Notes (Signed)
Urological Symptom Review  Patient is experiencing the following symptoms: Some urine leakage but better   Review of Systems  Gastrointestinal (upper)  : Negative for upper GI symptoms  Gastrointestinal (lower) : Negative for lower GI symptoms  Constitutional : Negative for symptoms  Skin: Negative for skin symptoms  Eyes: Negative for eye symptoms  Ear/Nose/Throat : Negative for Ear/Nose/Throat symptoms  Hematologic/Lymphatic: Negative for Hematologic/Lymphatic symptoms  Cardiovascular : Negative for cardiovascular symptoms  Respiratory : Negative for respiratory symptoms  Endocrine: Negative for endocrine symptoms  Musculoskeletal: Negative for musculoskeletal symptoms  Neurological: Negative for neurological symptoms  Psychologic: Negative for psychiatric symptoms

## 2020-07-23 NOTE — Progress Notes (Signed)
07/23/2020 2:54 PM   [name] [name] 06/30/1950 [id number]  Referring provider: Medicine, [facility] Internal [place],  [place] [address]  Followup urinary incontinence  HPI: Katherine Pope is a 70yo here for followup for urinary incontinence. She notes improvement in her urinary incontinence with toviaz 8mg  daily. She continues to have pelvic pain and pressure which was not improved with the toviaz. She continues to use 1-2 pads per day. She has daily urgency and urge incontinence   PMH: Past Medical History:  Diagnosis Date  . Arthritis   . Chronic back pain   . Depression   . GERD (gastroesophageal reflux disease)   . Hypercholesteremia   . Hypothyroidism     Surgical History: Past Surgical History:  Procedure Laterality Date  . ABDOMINAL HYSTERECTOMY    . CHOLECYSTECTOMY    . ERCP N/A 12/16/2018   Procedure: ENDOSCOPIC RETROGRADE CHOLANGIOPANCREATOGRAPHY (ERCP);  Surgeon: Rogene Houston, MD;  [facility];  Service: Endoscopy;  Laterality: N/A;  . EXTERNAL FIXATION PELVIS  12/03/2019    EXTERNAL FIXATION PELVIS (N/A )  . EXTERNAL FIXATION PELVIS N/A 12/03/2019   Procedure: EXTERNAL FIXATION PELVIS;  Surgeon: Shona Needles, MD;  Location: [place];  Service: Orthopedics;  Laterality: N/A;  . LAPAROSCOPIC CHOLECYSTECTOMY  05/2016  . LITHOTRIPSY  12/16/2018   Procedure: LITHOTRIPSY WITH BASKET;  Surgeon: Rogene Houston, MD;  [facility];  Service: Endoscopy;;  . REMOVAL OF STONES N/A 12/16/2018   Procedure: REMOVAL OF STONES WITH BASKET AND STONE BALLOON EXTRACTOR;  Surgeon: Rogene Houston, MD;  [facility];  Service: Endoscopy;  Laterality: N/A;  . SPHINCTEROTOMY N/A 12/16/2018   Procedure: SPHINCTEROTOMY;  Surgeon: Rogene Houston, MD;  [facility];  Service: Endoscopy;  Laterality: N/A;    Home Medications:  Allergies as of 07/23/2020      Reactions   Morphine And Related Shortness Of Breath   Morphine And Related    Oxygen  level dropped.       Medication List       Accurate as of July 23, 2020  2:54 PM. If you have any questions, ask your nurse or doctor.        acetaminophen 500 MG tablet Commonly known as: TYLENOL Take 2 tablets (1,000 mg total) by mouth every 6 (six) hours.   calcium carbonate 500 MG chewable tablet Commonly known as: TUMS - dosed in mg elemental calcium Chew 1 tablet (200 mg of elemental calcium total) by mouth 3 (three) times daily with meals.   citalopram 20 MG tablet Commonly known as: CELEXA Take 20 mg by mouth daily.   docusate sodium 100 MG capsule Commonly known as: COLACE Take 1 capsule (100 mg total) by mouth 2 (two) times daily.   fesoterodine 8 MG Tb24 tablet Commonly known as: TOVIAZ Take 1 tablet (8 mg total) by mouth daily.   gabapentin 100 MG capsule Commonly known as: NEURONTIN Take 1 capsule (100 mg total) by mouth 3 (three) times daily.   levothyroxine 88 MCG tablet Commonly known as: SYNTHROID Take 88 mcg by mouth daily.   LORazepam 0.5 MG tablet Commonly known as: ATIVAN Take 1-2 tablets (0.5-1 mg total) by mouth every 6 (six) hours as needed for anxiety or sleep.   methocarbamol 500 MG tablet Commonly known as: ROBAXIN Take 2 tablets (1,000 mg total) by mouth every 8 (eight) hours.   mirabegron ER 25 MG Tb24 tablet Commonly known as: MYRBETRIQ  Take 1 tablet (25 mg total) by mouth daily.   pantoprazole 40 MG tablet Commonly known as: PROTONIX Take 40 mg by mouth daily.   pantoprazole 40 MG tablet Commonly known as: PROTONIX Take 40 mg by mouth daily.   polyethylene glycol 17 g packet Commonly known as: MIRALAX / GLYCOLAX Take 17 g by mouth daily as needed for mild constipation.   Vitamin D3 25 MCG tablet Commonly known as: Vitamin D Take 2 tablets (2,000 Units total) by mouth daily.       Allergies:  Allergies  Allergen Reactions  . Morphine And Related Shortness Of Breath  . Morphine And Related     Oxygen level dropped.      Family History: Family History  Problem Relation Age of Onset  . COPD Mother   . Lung cancer Father   . Colon cancer Neg Hx   . Colon polyps Neg Hx     Social History:  reports that she quit smoking about 24 years ago. Her smoking use included cigarettes. She has a 20.00 pack-year smoking history. She has never used smokeless tobacco. She reports that she does not drink alcohol and does not use drugs.  ROS: All other review of systems were reviewed and are negative except what is noted above in HPI  Physical Exam: BP (!) 153/74   Pulse 87   Temp 98.7 F (37.1 C) (Oral)   Ht 5\' 5"  (1.651 m)   Wt 195 lb 12.8 oz (88.8 kg)   BMI 32.58 kg/m   Constitutional:  Alert and oriented, No acute distress. HEENT: [place] AT, moist mucus membranes.  Trachea midline, no masses. Cardiovascular: No clubbing, cyanosis, or edema. Respiratory: Normal respiratory effort, no increased work of breathing. GI: Abdomen is soft, nontender, nondistended, no abdominal masses GU: No CVA tenderness.  Lymph: No cervical or inguinal lymphadenopathy. Skin: No rashes, bruises or suspicious lesions. Neurologic: Grossly intact, no focal deficits, moving all 4 extremities. Psychiatric: Normal mood and affect.  Laboratory Data: Lab Results  Component Value Date   WBC 5.0 12/07/2019   HGB 8.3 (L) 12/07/2019   HCT 26.1 (L) 12/07/2019   MCV 96.0 12/07/2019   PLT 166 12/07/2019    Lab Results  Component Value Date   CREATININE 0.68 12/10/2019    No results found for: PSA  No results found for: TESTOSTERONE  No results found for: HGBA1C  Urinalysis    Component Value Date/Time   COLORURINE YELLOW 12/03/2019 1649   APPEARANCEUR Clear 06/23/2020 1419   LABSPEC >1.030 (H) 12/03/2019 1649   PHURINE 5.0 12/03/2019 1649   GLUCOSEU Negative 06/23/2020 1419   HGBUR LARGE (A) 12/03/2019 1649   BILIRUBINUR Negative 06/23/2020 1419   KETONESUR NEGATIVE 12/03/2019 1649   PROTEINUR Negative 06/23/2020 1419    PROTEINUR NEGATIVE 12/03/2019 1649   NITRITE Negative 06/23/2020 1419   NITRITE NEGATIVE 12/03/2019 1649   LEUKOCYTESUR Negative 06/23/2020 1419   LEUKOCYTESUR NEGATIVE 12/03/2019 1649    Lab Results  Component Value Date   LABMICR Comment 06/23/2020   BACTERIA FEW (A) 12/03/2019    Pertinent Imaging:  No results found for this or any previous visit.  No results found for this or any previous visit.  No results found for this or any previous visit.  No results found for this or any previous visit.  No results found for this or any previous visit.  No results found for this or any previous visit.  No results found for this or any previous visit.  No results found for this or any previous visit.   Assessment & Plan:    1. Urinary incontinence, unspecified type -Continue toviaz 8mg  -Referral to pelvic floor PT - Urinalysis, Routine w reflex microscopic - Ambulatory referral to Physical Therapy   Return in about 3 months (around 10/22/2020).  Nicolette Bang, MD  [facility] [place]

## 2020-08-05 ENCOUNTER — Encounter (HOSPITAL_COMMUNITY): Payer: Self-pay | Admitting: Physical Therapy

## 2020-08-05 ENCOUNTER — Other Ambulatory Visit: Payer: Self-pay

## 2020-08-05 ENCOUNTER — Ambulatory Visit (HOSPITAL_COMMUNITY): Payer: Medicare HMO | Attending: Urology | Admitting: Physical Therapy

## 2020-08-05 DIAGNOSIS — N3942 Incontinence without sensory awareness: Secondary | ICD-10-CM | POA: Diagnosis not present
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
## 2020-08-10 ENCOUNTER — Telehealth: Payer: Self-pay

## 2020-08-10 NOTE — Telephone Encounter (Signed)
Returned a call to pt from message yesterday. No answer. Left message to return call.

## 2020-08-11 ENCOUNTER — Telehealth: Payer: Self-pay | Admitting: Urology

## 2020-08-11 NOTE — Telephone Encounter (Signed)
Pt called and said she didn't feel like Physical Therapy is helping her. The Physical Therapist said that she didn't think that it was a muscle issue. She thinks it's a nerve issue. Pt wants to know if she can do acupuncture instead. Please advise.

## 2020-08-12 ENCOUNTER — Telehealth (HOSPITAL_COMMUNITY): Payer: Self-pay | Admitting: Physical Therapy

## 2020-08-12 DIAGNOSIS — G4700 Insomnia, unspecified: Secondary | ICD-10-CM | POA: Diagnosis not present

## 2020-08-12 DIAGNOSIS — Z299 Encounter for prophylactic measures, unspecified: Secondary | ICD-10-CM | POA: Diagnosis not present

## 2020-08-12 DIAGNOSIS — S329XXA Fracture of unspecified parts of lumbosacral spine and pelvis, initial encounter for closed fracture: Secondary | ICD-10-CM | POA: Diagnosis not present

## 2020-08-12 DIAGNOSIS — M25552 Pain in left hip: Secondary | ICD-10-CM | POA: Diagnosis not present

## 2020-08-12 DIAGNOSIS — Z6832 Body mass index (BMI) 32.0-32.9, adult: Secondary | ICD-10-CM | POA: Diagnosis not present

## 2020-08-12 DIAGNOSIS — M25551 Pain in right hip: Secondary | ICD-10-CM | POA: Diagnosis not present

## 2020-08-12 NOTE — Telephone Encounter (Signed)
Attempted to return pts call. Left message to return call to [place].

## 2020-08-12 NOTE — Telephone Encounter (Signed)
She wants to cx today - she has another apptment that can't be missed.

## 2020-08-13 ENCOUNTER — Ambulatory Visit (HOSPITAL_COMMUNITY): Payer: Medicare HMO | Admitting: Physical Therapy

## 2020-08-13 ENCOUNTER — Encounter (HOSPITAL_COMMUNITY): Payer: Self-pay

## 2020-08-19 ENCOUNTER — Encounter (HOSPITAL_COMMUNITY): Payer: Medicare HMO | Admitting: Physical Therapy

## 2020-08-21 DIAGNOSIS — E7800 Pure hypercholesterolemia, unspecified: Secondary | ICD-10-CM | POA: Diagnosis not present

## 2020-08-21 DIAGNOSIS — E039 Hypothyroidism, unspecified: Secondary | ICD-10-CM | POA: Diagnosis not present

## 2020-08-26 ENCOUNTER — Encounter (HOSPITAL_COMMUNITY): Payer: Medicare HMO | Admitting: Physical Therapy

## 2020-09-02 ENCOUNTER — Encounter (HOSPITAL_COMMUNITY): Payer: Medicare HMO | Admitting: Physical Therapy

## 2020-09-20 DIAGNOSIS — E7800 Pure hypercholesterolemia, unspecified: Secondary | ICD-10-CM | POA: Diagnosis not present

## 2020-09-20 DIAGNOSIS — E039 Hypothyroidism, unspecified: Secondary | ICD-10-CM | POA: Diagnosis not present

## 2020-09-21 DIAGNOSIS — Z1211 Encounter for screening for malignant neoplasm of colon: Secondary | ICD-10-CM | POA: Diagnosis not present

## 2020-09-21 DIAGNOSIS — R5383 Other fatigue: Secondary | ICD-10-CM | POA: Diagnosis not present

## 2020-09-21 DIAGNOSIS — E039 Hypothyroidism, unspecified: Secondary | ICD-10-CM | POA: Diagnosis not present

## 2020-09-21 DIAGNOSIS — F331 Major depressive disorder, recurrent, moderate: Secondary | ICD-10-CM | POA: Diagnosis not present

## 2020-09-21 DIAGNOSIS — Z87891 Personal history of nicotine dependence: Secondary | ICD-10-CM | POA: Diagnosis not present

## 2020-09-21 DIAGNOSIS — D692 Other nonthrombocytopenic purpura: Secondary | ICD-10-CM | POA: Diagnosis not present

## 2020-09-21 DIAGNOSIS — Z299 Encounter for prophylactic measures, unspecified: Secondary | ICD-10-CM | POA: Diagnosis not present

## 2020-09-21 DIAGNOSIS — Z1339 Encounter for screening examination for other mental health and behavioral disorders: Secondary | ICD-10-CM | POA: Diagnosis not present

## 2020-09-21 DIAGNOSIS — Z7189 Other specified counseling: Secondary | ICD-10-CM | POA: Diagnosis not present

## 2020-09-21 DIAGNOSIS — E7800 Pure hypercholesterolemia, unspecified: Secondary | ICD-10-CM | POA: Diagnosis not present

## 2020-09-21 DIAGNOSIS — Z6832 Body mass index (BMI) 32.0-32.9, adult: Secondary | ICD-10-CM | POA: Diagnosis not present

## 2020-09-21 DIAGNOSIS — N1831 Chronic kidney disease, stage 3a: Secondary | ICD-10-CM | POA: Diagnosis not present

## 2020-09-21 DIAGNOSIS — E669 Obesity, unspecified: Secondary | ICD-10-CM | POA: Diagnosis not present

## 2020-09-21 DIAGNOSIS — Z79899 Other long term (current) drug therapy: Secondary | ICD-10-CM | POA: Diagnosis not present

## 2020-09-21 DIAGNOSIS — Z0000 Encounter for general adult medical examination without abnormal findings: Secondary | ICD-10-CM | POA: Diagnosis not present

## 2020-09-21 DIAGNOSIS — Z1331 Encounter for screening for depression: Secondary | ICD-10-CM | POA: Diagnosis not present

## 2020-10-05 DIAGNOSIS — S32811D Multiple fractures of pelvis with unstable disruption of pelvic ring, subsequent encounter for fracture with routine healing: Secondary | ICD-10-CM | POA: Diagnosis not present

## 2020-10-11 ENCOUNTER — Ambulatory Visit: Payer: Self-pay | Admitting: Student

## 2020-10-11 DIAGNOSIS — S329XXA Fracture of unspecified parts of lumbosacral spine and pelvis, initial encounter for closed fracture: Secondary | ICD-10-CM | POA: Insufficient documentation

## 2020-10-22 ENCOUNTER — Other Ambulatory Visit: Payer: Self-pay

## 2020-10-22 ENCOUNTER — Encounter: Payer: Self-pay | Admitting: Urology

## 2020-10-22 ENCOUNTER — Ambulatory Visit: Payer: Medicare HMO | Admitting: Urology

## 2020-10-22 VITALS — BP 131/79 | HR 85

## 2020-10-22 DIAGNOSIS — R32 Unspecified urinary incontinence: Secondary | ICD-10-CM

## 2020-10-22 LAB — URINALYSIS, ROUTINE W REFLEX MICROSCOPIC
Bilirubin, UA: NEGATIVE
Glucose, UA: NEGATIVE
Leukocytes,UA: NEGATIVE
Nitrite, UA: NEGATIVE
Protein,UA: NEGATIVE
RBC, UA: NEGATIVE
Specific Gravity, UA: 1.025 (ref 1.005–1.030)
Urobilinogen, Ur: 0.2 mg/dL (ref 0.2–1.0)
pH, UA: 5.5 (ref 5.0–7.5)

## 2020-10-22 NOTE — Progress Notes (Signed)
Urological Symptom Review  Patient is experiencing the following symptoms: Frequent urination Leakage of urine Stream starts and stops Have to strain to urinate Injury to kidneys/bladder   Review of Systems  Gastrointestinal (upper)  : Negative for upper GI symptoms  Gastrointestinal (lower) : Negative for lower GI symptoms  Constitutional : Negative for symptoms  Skin: Negative for skin symptoms  Eyes: Negative for eye symptoms  Ear/Nose/Throat : Negative for Ear/Nose/Throat symptoms  Hematologic/Lymphatic: Negative for Hematologic/Lymphatic symptoms  Cardiovascular : Negative for cardiovascular symptoms  Respiratory : Negative for respiratory symptoms  Endocrine: Negative for endocrine symptoms  Musculoskeletal: Back pain Joint pain  Neurological: Negative for neurological symptoms  Psychologic: Negative for psychiatric symptoms

## 2020-10-22 NOTE — Progress Notes (Signed)
10/22/2020 2:37 PM   [name] [name] April 04, 1951 [id number]  Referring provider: Medicine, [facility] Internal [place],  [place] [address]  Followup urinary incontinence  HPI: Katherine Pope is a 70yo here for followup for urinary incontinence. She has failed mirabegron and toviaz. She saw pelvic floor PT which did not improve incontinence. She uses 5-6 pads per day which are soaked. Nocturia 2x. She is unhappy with her incontinence.    PMH: Past Medical History:  Diagnosis Date   Arthritis    Chronic back pain    Depression    GERD (gastroesophageal reflux disease)    Hypercholesteremia    Hypothyroidism     Surgical History: Past Surgical History:  Procedure Laterality Date   ABDOMINAL HYSTERECTOMY     CHOLECYSTECTOMY     ERCP N/A 12/16/2018   Procedure: ENDOSCOPIC RETROGRADE CHOLANGIOPANCREATOGRAPHY (ERCP);  Surgeon: Rogene Houston, MD;  [facility];  Service: Endoscopy;  Laterality: N/A;   EXTERNAL FIXATION PELVIS  12/03/2019    EXTERNAL FIXATION PELVIS (N/A )   EXTERNAL FIXATION PELVIS N/A 12/03/2019   Procedure: EXTERNAL FIXATION PELVIS;  Surgeon: Shona Needles, MD;  Location: [place];  Service: Orthopedics;  Laterality: N/A;   LAPAROSCOPIC CHOLECYSTECTOMY  05/2016   LITHOTRIPSY  12/16/2018   Procedure: LITHOTRIPSY WITH BASKET;  Surgeon: Rogene Houston, MD;  [facility];  Service: Endoscopy;;   REMOVAL OF STONES N/A 12/16/2018   Procedure: REMOVAL OF STONES WITH BASKET AND STONE BALLOON EXTRACTOR;  Surgeon: Rogene Houston, MD;  [facility];  Service: Endoscopy;  Laterality: N/A;   SPHINCTEROTOMY N/A 12/16/2018   Procedure: SPHINCTEROTOMY;  Surgeon: Rogene Houston, MD;  [facility];  Service: Endoscopy;  Laterality: N/A;    Home Medications:  Allergies as of 10/22/2020       Reactions   Morphine And Related Shortness Of Breath   Morphine And Related    Oxygen level dropped.         Medication List         Accurate as of October 22, 2020  2:37 PM. If you have any questions, ask your nurse or doctor.          STOP taking these medications    acetaminophen 500 MG tablet Commonly known as: TYLENOL Stopped by: Nicolette Bang, MD   polyethylene glycol 17 g packet Commonly known as: MIRALAX / GLYCOLAX Stopped by: Nicolette Bang, MD       TAKE these medications    calcium carbonate 500 MG chewable tablet Commonly known as: TUMS - dosed in mg elemental calcium Chew 1 tablet (200 mg of elemental calcium total) by mouth 3 (three) times daily with meals.   citalopram 20 MG tablet Commonly known as: CELEXA Take 20 mg by mouth daily.   docusate sodium 100 MG capsule Commonly known as: COLACE Take 1 capsule (100 mg total) by mouth 2 (two) times daily.   fesoterodine 8 MG Tb24 tablet Commonly known as: TOVIAZ Take 1 tablet (8 mg total) by mouth daily.   gabapentin 100 MG capsule Commonly known as: NEURONTIN Take 1 capsule (100 mg total) by mouth 3 (three) times daily.   HYDROcodone-acetaminophen 5-325 MG tablet Commonly known as: NORCO/VICODIN Take 1 tablet by mouth at bedtime as needed.   levothyroxine 88 MCG tablet Commonly known as: SYNTHROID Take 88 mcg by mouth daily.   LORazepam 0.5 MG tablet Commonly known as: ATIVAN Take 1-2 tablets (0.5-1 mg total)  by mouth every 6 (six) hours as needed for anxiety or sleep.   methocarbamol 500 MG tablet Commonly known as: ROBAXIN Take 2 tablets (1,000 mg total) by mouth every 8 (eight) hours.   mirabegron ER 25 MG Tb24 tablet Commonly known as: MYRBETRIQ Take 1 tablet (25 mg total) by mouth daily.   pantoprazole 40 MG tablet Commonly known as: PROTONIX Take 40 mg by mouth daily.   pantoprazole 40 MG tablet Commonly known as: PROTONIX Take 40 mg by mouth daily.   Vitamin D3 25 MCG tablet Commonly known as: Vitamin D Take 2 tablets (2,000 Units total) by mouth daily.        Allergies:  Allergies  Allergen  Reactions   Morphine And Related Shortness Of Breath   Morphine And Related     Oxygen level dropped.     Family History: Family History  Problem Relation Age of Onset   COPD Mother    Lung cancer Father    Colon cancer Neg Hx    Colon polyps Neg Hx     Social History:  reports that she quit smoking about 24 years ago. Her smoking use included cigarettes. She has a 20.00 pack-year smoking history. She has never used smokeless tobacco. She reports that she does not drink alcohol and does not use drugs.  ROS: All other review of systems were reviewed and are negative except what is noted above in HPI  Physical Exam: BP 131/79   Pulse 85   Constitutional:  Alert and oriented, No acute distress. HEENT: [place] AT, moist mucus membranes.  Trachea midline, no masses. Cardiovascular: No clubbing, cyanosis, or edema. Respiratory: Normal respiratory effort, no increased work of breathing. GI: Abdomen is soft, nontender, nondistended, no abdominal masses GU: No CVA tenderness.  Lymph: No cervical or inguinal lymphadenopathy. Skin: No rashes, bruises or suspicious lesions. Neurologic: Grossly intact, no focal deficits, moving all 4 extremities. Psychiatric: Normal mood and affect.  Laboratory Data: Lab Results  Component Value Date   WBC 5.0 12/07/2019   HGB 8.3 (L) 12/07/2019   HCT 26.1 (L) 12/07/2019   MCV 96.0 12/07/2019   PLT 166 12/07/2019    Lab Results  Component Value Date   CREATININE 0.68 12/10/2019    No results found for: PSA  No results found for: TESTOSTERONE  No results found for: HGBA1C  Urinalysis    Component Value Date/Time   COLORURINE YELLOW 12/03/2019 1649   APPEARANCEUR Clear 07/23/2020 1455   LABSPEC >1.030 (H) 12/03/2019 1649   PHURINE 5.0 12/03/2019 1649   GLUCOSEU Negative 07/23/2020 1455   HGBUR LARGE (A) 12/03/2019 1649   BILIRUBINUR Negative 07/23/2020 1455   KETONESUR NEGATIVE 12/03/2019 1649   PROTEINUR Negative 07/23/2020 1455    PROTEINUR NEGATIVE 12/03/2019 1649   NITRITE Negative 07/23/2020 1455   NITRITE NEGATIVE 12/03/2019 1649   LEUKOCYTESUR Negative 07/23/2020 1455   LEUKOCYTESUR NEGATIVE 12/03/2019 1649    Lab Results  Component Value Date   LABMICR See below: 07/23/2020   WBCUA None seen 07/23/2020   LABEPIT 0-10 07/23/2020   BACTERIA Few 07/23/2020    Pertinent Imaging:  No results found for this or any previous visit.  No results found for this or any previous visit.  No results found for this or any previous visit.  No results found for this or any previous visit.  No results found for this or any previous visit.  No results found for this or any previous visit.  No results found for this  or any previous visit.  No results found for this or any previous visit.   Assessment & Plan:    1. Urinary incontinence, unspecified type -We will start PTNS - Urinalysis, Routine w reflex microscopic   No follow-ups on file.  Nicolette Bang, MD  [facility] [place]

## 2020-10-26 NOTE — Patient Instructions (Signed)

## 2020-11-09 ENCOUNTER — Other Ambulatory Visit: Payer: Self-pay

## 2020-11-09 ENCOUNTER — Encounter (HOSPITAL_COMMUNITY): Payer: Self-pay | Admitting: Student

## 2020-11-09 NOTE — Progress Notes (Signed)
Spoke with pt for pre-op call. Pt denies HTN, cardiac hx or Diabetes.   Pt's surgery is scheduled as ambulatory so no Covid test is required prior to surgery. Denies any recent Covid symptoms

## 2020-11-10 ENCOUNTER — Encounter (HOSPITAL_COMMUNITY)
Admission: RE | Disposition: A | Discharge status: Home / Self Care | Payer: Self-pay | Source: Home / Self Care | Attending: Student

## 2020-11-10 ENCOUNTER — Ambulatory Visit (HOSPITAL_COMMUNITY)
Admission: RE | Admit: 2020-11-10 | Discharge: 2020-11-10 | Disposition: A | Discharge status: Home / Self Care | Payer: Medicare HMO | Attending: Student | Admitting: Student

## 2020-11-10 ENCOUNTER — Ambulatory Visit (HOSPITAL_COMMUNITY): Payer: Medicare HMO | Admitting: Certified Registered"

## 2020-11-10 ENCOUNTER — Encounter (HOSPITAL_COMMUNITY): Payer: Self-pay | Admitting: Student

## 2020-11-10 ENCOUNTER — Ambulatory Visit (HOSPITAL_COMMUNITY): Payer: Medicare HMO

## 2020-11-10 DIAGNOSIS — F418 Other specified anxiety disorders: Secondary | ICD-10-CM | POA: Diagnosis not present

## 2020-11-10 DIAGNOSIS — Z8616 Personal history of COVID-19: Secondary | ICD-10-CM | POA: Insufficient documentation

## 2020-11-10 DIAGNOSIS — Z885 Allergy status to narcotic agent status: Secondary | ICD-10-CM | POA: Insufficient documentation

## 2020-11-10 DIAGNOSIS — S32810B Multiple fractures of pelvis with stable disruption of pelvic ring, initial encounter for open fracture: Secondary | ICD-10-CM | POA: Diagnosis not present

## 2020-11-10 DIAGNOSIS — T8484XA Pain due to internal orthopedic prosthetic devices, implants and grafts, initial encounter: Secondary | ICD-10-CM | POA: Insufficient documentation

## 2020-11-10 DIAGNOSIS — Z87891 Personal history of nicotine dependence: Secondary | ICD-10-CM | POA: Insufficient documentation

## 2020-11-10 DIAGNOSIS — Z472 Encounter for removal of internal fixation device: Secondary | ICD-10-CM | POA: Diagnosis not present

## 2020-11-10 DIAGNOSIS — S329XXA Fracture of unspecified parts of lumbosacral spine and pelvis, initial encounter for closed fracture: Secondary | ICD-10-CM | POA: Insufficient documentation

## 2020-11-10 DIAGNOSIS — Z419 Encounter for procedure for purposes other than remedying health state, unspecified: Secondary | ICD-10-CM

## 2020-11-10 DIAGNOSIS — Y798 Miscellaneous orthopedic devices associated with adverse incidents, not elsewhere classified: Secondary | ICD-10-CM | POA: Insufficient documentation

## 2020-11-10 DIAGNOSIS — K219 Gastro-esophageal reflux disease without esophagitis: Secondary | ICD-10-CM | POA: Diagnosis not present

## 2020-11-10 DIAGNOSIS — E7800 Pure hypercholesterolemia, unspecified: Secondary | ICD-10-CM | POA: Diagnosis not present

## 2020-11-10 HISTORY — DX: Malignant (primary) neoplasm, unspecified: C80.1

## 2020-11-10 HISTORY — PX: HARDWARE REMOVAL: SHX979

## 2020-11-10 HISTORY — DX: Anxiety disorder, unspecified: F41.9

## 2020-11-10 HISTORY — DX: Other specified postprocedural states: Z98.890

## 2020-11-10 HISTORY — DX: Nausea with vomiting, unspecified: R11.2

## 2020-11-10 LAB — CBC
HCT: 42.5 % (ref 36.0–46.0)
Hemoglobin: 13.3 g/dL (ref 12.0–15.0)
MCH: 29.6 pg (ref 26.0–34.0)
MCHC: 31.3 g/dL (ref 30.0–36.0)
MCV: 94.7 fL (ref 80.0–100.0)
Platelets: 212 10*3/uL (ref 150–400)
RBC: 4.49 MIL/uL (ref 3.87–5.11)
RDW: 13.9 % (ref 11.5–15.5)
WBC: 6.8 10*3/uL (ref 4.0–10.5)
nRBC: 0 % (ref 0.0–0.2)

## 2020-11-10 SURGERY — REMOVAL, HARDWARE
Anesthesia: General | Site: Pelvis | Laterality: Left

## 2020-11-10 MED ORDER — SCOPOLAMINE 1 MG/3DAYS TD PT72
1.0000 | MEDICATED_PATCH | TRANSDERMAL | Status: DC
  Administered 2020-11-10: 1.5 mg via TRANSDERMAL

## 2020-11-10 MED ORDER — HYDROCODONE-ACETAMINOPHEN 5-325 MG PO TABS: 1.0000 | ORAL_TABLET | Freq: Four times a day (QID) | ORAL | 0 refills | Status: AC | PRN

## 2020-11-10 MED ORDER — APREPITANT 40 MG PO CAPS
40.0000 mg | ORAL_CAPSULE | Freq: Once | ORAL | Status: DC
  Filled 2020-11-10: qty 1

## 2020-11-10 MED ORDER — FENTANYL CITRATE (PF) 100 MCG/2ML IJ SOLN
INTRAMUSCULAR | Status: AC
  Filled 2020-11-10: qty 2

## 2020-11-10 MED ORDER — 0.9 % SODIUM CHLORIDE (POUR BTL) OPTIME
TOPICAL | Status: DC | PRN
  Administered 2020-11-10: 1000 mL

## 2020-11-10 MED ORDER — FENTANYL CITRATE (PF) 100 MCG/2ML IJ SOLN
INTRAMUSCULAR | Status: DC | PRN
  Administered 2020-11-10 (×3): 50 ug via INTRAVENOUS

## 2020-11-10 MED ORDER — MIDAZOLAM HCL 2 MG/2ML IJ SOLN
INTRAMUSCULAR | Status: AC
  Filled 2020-11-10: qty 2

## 2020-11-10 MED ORDER — LACTATED RINGERS IV SOLN: INTRAVENOUS | Status: DC

## 2020-11-10 MED ORDER — FENTANYL CITRATE (PF) 100 MCG/2ML IJ SOLN
25.0000 ug | INTRAMUSCULAR | Status: DC | PRN
  Administered 2020-11-10: 50 ug via INTRAVENOUS

## 2020-11-10 MED ORDER — DEXAMETHASONE SODIUM PHOSPHATE 10 MG/ML IJ SOLN
INTRAMUSCULAR | Status: DC | PRN
  Administered 2020-11-10: 10 mg via INTRAVENOUS

## 2020-11-10 MED ORDER — LIDOCAINE HCL (PF) 2 % IJ SOLN
INTRAMUSCULAR | Status: DC | PRN
  Administered 2020-11-10: 60 mg via INTRADERMAL

## 2020-11-10 MED ORDER — PROPOFOL 10 MG/ML IV BOLUS
INTRAVENOUS | Status: AC
  Filled 2020-11-10: qty 20

## 2020-11-10 MED ORDER — HYDROCODONE-ACETAMINOPHEN 5-325 MG PO TABS
ORAL_TABLET | ORAL | Status: AC
  Filled 2020-11-10: qty 1

## 2020-11-10 MED ORDER — ACETAMINOPHEN 500 MG PO TABS
1000.0000 mg | ORAL_TABLET | Freq: Once | ORAL | Status: AC
  Administered 2020-11-10: 1000 mg via ORAL
  Filled 2020-11-10: qty 2

## 2020-11-10 MED ORDER — HYDROCODONE-ACETAMINOPHEN 5-325 MG PO TABS
1.0000 | ORAL_TABLET | Freq: Once | ORAL | Status: AC
  Administered 2020-11-10: 1 via ORAL

## 2020-11-10 MED ORDER — FENTANYL CITRATE (PF) 250 MCG/5ML IJ SOLN
INTRAMUSCULAR | Status: AC
  Filled 2020-11-10: qty 5

## 2020-11-10 MED ORDER — PHENYLEPHRINE 40 MCG/ML (10ML) SYRINGE FOR IV PUSH (FOR BLOOD PRESSURE SUPPORT)
PREFILLED_SYRINGE | INTRAVENOUS | Status: DC | PRN
  Administered 2020-11-10: 120 ug via INTRAVENOUS
  Administered 2020-11-10: 80 ug via INTRAVENOUS

## 2020-11-10 MED ORDER — ROCURONIUM BROMIDE 10 MG/ML (PF) SYRINGE
PREFILLED_SYRINGE | INTRAVENOUS | Status: DC | PRN
  Administered 2020-11-10: 70 mg via INTRAVENOUS

## 2020-11-10 MED ORDER — PROPOFOL 500 MG/50ML IV EMUL
INTRAVENOUS | Status: DC | PRN
  Administered 2020-11-10: 250 ug/kg/min via INTRAVENOUS

## 2020-11-10 MED ORDER — SUGAMMADEX SODIUM 200 MG/2ML IV SOLN
INTRAVENOUS | Status: DC | PRN
  Administered 2020-11-10 (×2): 200 mg via INTRAVENOUS

## 2020-11-10 MED ORDER — PROPOFOL 10 MG/ML IV BOLUS
INTRAVENOUS | Status: DC | PRN
  Administered 2020-11-10: 150 mg via INTRAVENOUS
  Administered 2020-11-10: 175 mg via INTRAVENOUS

## 2020-11-10 MED ORDER — PHENYLEPHRINE HCL-NACL 10-0.9 MG/250ML-% IV SOLN
INTRAVENOUS | Status: DC | PRN
  Administered 2020-11-10: 50 ug/min via INTRAVENOUS

## 2020-11-10 MED ORDER — MIDAZOLAM HCL 2 MG/2ML IJ SOLN
INTRAMUSCULAR | Status: DC | PRN
  Administered 2020-11-10: 2 mg via INTRAVENOUS

## 2020-11-10 MED ORDER — SCOPOLAMINE 1 MG/3DAYS TD PT72
MEDICATED_PATCH | TRANSDERMAL | Status: AC
  Filled 2020-11-10: qty 1

## 2020-11-10 MED ORDER — CHLORHEXIDINE GLUCONATE 0.12 % MT SOLN
15.0000 mL | OROMUCOSAL | Status: AC
  Administered 2020-11-10: 15 mL via OROMUCOSAL
  Filled 2020-11-10 (×2): qty 15

## 2020-11-10 MED ORDER — ONDANSETRON HCL 4 MG/2ML IJ SOLN
INTRAMUSCULAR | Status: DC | PRN
  Administered 2020-11-10: 4 mg via INTRAVENOUS

## 2020-11-10 MED ORDER — CEFAZOLIN SODIUM-DEXTROSE 2-4 GM/100ML-% IV SOLN
2.0000 g | INTRAVENOUS | Status: AC
  Administered 2020-11-10: 2 g via INTRAVENOUS
  Filled 2020-11-10: qty 100

## 2020-11-10 SURGICAL SUPPLY — 63 items
BAG COUNTER SPONGE SURGICOUNT (BAG) ×2 IMPLANT
BANDAGE ESMARK 6X9 LF (GAUZE/BANDAGES/DRESSINGS) IMPLANT
BNDG COHESIVE 6X5 TAN STRL LF (GAUZE/BANDAGES/DRESSINGS) ×2 IMPLANT
BNDG ELASTIC 4X5.8 VLCR STR LF (GAUZE/BANDAGES/DRESSINGS) ×2 IMPLANT
BNDG ELASTIC 6X5.8 VLCR STR LF (GAUZE/BANDAGES/DRESSINGS) ×2 IMPLANT
BNDG ESMARK 6X9 LF (GAUZE/BANDAGES/DRESSINGS)
BNDG GAUZE ELAST 4 BULKY (GAUZE/BANDAGES/DRESSINGS) ×4 IMPLANT
BRUSH SCRUB EZ PLAIN DRY (MISCELLANEOUS) ×4 IMPLANT
CHLORAPREP W/TINT 26 (MISCELLANEOUS) IMPLANT
COVER SURGICAL LIGHT HANDLE (MISCELLANEOUS) ×2 IMPLANT
CUFF TOURN SGL QUICK 18X4 (TOURNIQUET CUFF) IMPLANT
CUFF TOURN SGL QUICK 24 (TOURNIQUET CUFF)
CUFF TOURN SGL QUICK 34 (TOURNIQUET CUFF)
CUFF TRNQT CYL 24X4X16.5-23 (TOURNIQUET CUFF) IMPLANT
CUFF TRNQT CYL 34X4.125X (TOURNIQUET CUFF) IMPLANT
DERMABOND ADVANCED (GAUZE/BANDAGES/DRESSINGS) ×1
DERMABOND ADVANCED .7 DNX12 (GAUZE/BANDAGES/DRESSINGS) ×1 IMPLANT
DRAPE C-ARM 42X72 X-RAY (DRAPES) ×2 IMPLANT
DRAPE C-ARMOR (DRAPES) ×2 IMPLANT
DRAPE INCISE IOBAN 66X45 STRL (DRAPES) ×2 IMPLANT
DRAPE U-SHAPE 47X51 STRL (DRAPES) ×2 IMPLANT
DRESSING MEPILEX FLEX 4X4 (GAUZE/BANDAGES/DRESSINGS) ×1 IMPLANT
DRSG ADAPTIC 3X8 NADH LF (GAUZE/BANDAGES/DRESSINGS) IMPLANT
DRSG MEPILEX FLEX 4X4 (GAUZE/BANDAGES/DRESSINGS) ×2
ELECT REM PT RETURN 9FT ADLT (ELECTROSURGICAL) ×2
ELECTRODE REM PT RTRN 9FT ADLT (ELECTROSURGICAL) ×1 IMPLANT
GAUZE SPONGE 4X4 12PLY STRL (GAUZE/BANDAGES/DRESSINGS) ×2 IMPLANT
GLOVE SURG ENC MOIS LTX SZ6.5 (GLOVE) ×6 IMPLANT
GLOVE SURG ENC MOIS LTX SZ7.5 (GLOVE) ×8 IMPLANT
GLOVE SURG UNDER POLY LF SZ6.5 (GLOVE) ×2 IMPLANT
GLOVE SURG UNDER POLY LF SZ7.5 (GLOVE) ×2 IMPLANT
GOWN STRL REUS W/ TWL LRG LVL3 (GOWN DISPOSABLE) ×2 IMPLANT
GOWN STRL REUS W/TWL LRG LVL3 (GOWN DISPOSABLE) ×2
KIT BASIN OR (CUSTOM PROCEDURE TRAY) ×2 IMPLANT
KIT TURNOVER KIT B (KITS) ×2 IMPLANT
MANIFOLD NEPTUNE II (INSTRUMENTS) ×2 IMPLANT
NEEDLE 22X1 1/2 (OR ONLY) (NEEDLE) IMPLANT
NS IRRIG 1000ML POUR BTL (IV SOLUTION) ×2 IMPLANT
PACK ORTHO EXTREMITY (CUSTOM PROCEDURE TRAY) ×2 IMPLANT
PAD ARMBOARD 7.5X6 YLW CONV (MISCELLANEOUS) ×4 IMPLANT
PADDING CAST COTTON 6X4 STRL (CAST SUPPLIES) ×6 IMPLANT
SPONGE T-LAP 18X18 ~~LOC~~+RFID (SPONGE) ×2 IMPLANT
STAPLER VISISTAT 35W (STAPLE) IMPLANT
STOCKINETTE IMPERVIOUS LG (DRAPES) ×2 IMPLANT
STRIP CLOSURE SKIN 1/2X4 (GAUZE/BANDAGES/DRESSINGS) IMPLANT
SUCTION FRAZIER HANDLE 10FR (MISCELLANEOUS)
SUCTION TUBE FRAZIER 10FR DISP (MISCELLANEOUS) IMPLANT
SUT ETHILON 3 0 PS 1 (SUTURE) IMPLANT
SUT MNCRL AB 3-0 PS2 18 (SUTURE) IMPLANT
SUT MNCRL AB 3-0 PS2 27 (SUTURE) ×2 IMPLANT
SUT MON AB 2-0 CT1 36 (SUTURE) IMPLANT
SUT PDS AB 2-0 CT1 27 (SUTURE) IMPLANT
SUT VIC AB 0 CT1 27 (SUTURE)
SUT VIC AB 0 CT1 27XBRD ANBCTR (SUTURE) IMPLANT
SUT VIC AB 2-0 CT1 27 (SUTURE)
SUT VIC AB 2-0 CT1 TAPERPNT 27 (SUTURE) IMPLANT
SYR CONTROL 10ML LL (SYRINGE) IMPLANT
TOWEL GREEN STERILE (TOWEL DISPOSABLE) ×4 IMPLANT
TOWEL GREEN STERILE FF (TOWEL DISPOSABLE) ×4 IMPLANT
TUBE CONNECTING 12X1/4 (SUCTIONS) ×2 IMPLANT
UNDERPAD 30X36 HEAVY ABSORB (UNDERPADS AND DIAPERS) ×2 IMPLANT
WATER STERILE IRR 1000ML POUR (IV SOLUTION) ×2 IMPLANT
YANKAUER SUCT BULB TIP NO VENT (SUCTIONS) ×2 IMPLANT

## 2020-11-10 NOTE — Transfer of Care (Signed)
Immediate Anesthesia Transfer of Care Note  Patient: Katherine Pope  Procedure(s) Performed: HARDWARE REMOVAL FROM PELVIS (Left: Pelvis)  Patient Location: PACU  Anesthesia Type:General  Level of Consciousness: drowsy and patient cooperative  Airway & Oxygen Therapy: Patient Spontanous Breathing and Patient connected to face mask oxygen  Post-op Assessment: Report given to RN, Post -op Vital signs reviewed and stable and Patient moving all extremities X 4  Post vital signs: Reviewed and stable  Last Vitals:  Vitals Value Taken Time  BP 163/90 11/10/20 1158  Temp    Pulse 90 11/10/20 1159  Resp 23 11/10/20 1159  SpO2 93 % 11/10/20 1159  Vitals shown include unvalidated device data.  Last Pain:  Vitals:   11/10/20 0835  TempSrc:   PainSc: 0-No pain      Patients Stated Pain Goal: 0 (88/89/16 9450)  Complications: No notable events documented.

## 2020-11-10 NOTE — Progress Notes (Signed)
Wasted 50 mcg fentanyl with witness Earlie Lou.

## 2020-11-10 NOTE — Interval H&P Note (Signed)
History and Physical Interval Note:  11/10/2020 9:49 AM  Katherine Pope  has presented today for surgery, with the diagnosis of PAinful hardware.  The various methods of treatment have been discussed with the patient and family. After consideration of risks, benefits and other options for treatment, the patient has consented to  Procedure(s): HARDWARE REMOVAL FROM PELVIS (Left) as a surgical intervention.  The patient's history has been reviewed, patient examined, no change in status, stable for surgery.  I have reviewed the patient's chart and labs.  Questions were answered to the patient's satisfaction.     Lennette Bihari P Carleena Mires

## 2020-11-10 NOTE — H&P (Signed)
Orthopaedic Trauma Service (OTS) H&P   Patient ID: Katherine Pope MRN: [id number] DOB/AGE: 07/08/1950 71 y.o.  Reason for Surgery: Removal of Hardware  HPI: Katherine Pope is an 70 y.o. female who presents for removal of posterior pelvic fixation.  Patient had an open pelvic ring injury in August 2021.  She underwent open reduction internal fixation of her symphysis as well as percutaneous fixation of the posterior pelvis.  She initially did well but unfortunately over the last few months she has had increased amount of pain especially located in the back of the pelvis.  Due to the continued pain and presence of the hardware I recommended proceeding with a removal of the posterior pelvic fixation.  Past Medical History:  Diagnosis Date   Anxiety    Arthritis    Cancer ([place])    Cervical - age 23   Chronic back pain    COVID-19 2020   Depression    GERD (gastroesophageal reflux disease)    Hypercholesteremia    Hypothyroidism    PONV (postoperative nausea and vomiting)    after pelvis surgery - 7 hour surgery    Past Surgical History:  Procedure Laterality Date   ABDOMINAL HYSTERECTOMY     CHOLECYSTECTOMY     ERCP N/A 12/16/2018   Procedure: ENDOSCOPIC RETROGRADE CHOLANGIOPANCREATOGRAPHY (ERCP);  Surgeon: Rogene Houston, MD;  [facility];  Service: Endoscopy;  Laterality: N/A;   EXTERNAL FIXATION PELVIS  12/03/2019    EXTERNAL FIXATION PELVIS (N/A )   EXTERNAL FIXATION PELVIS N/A 12/03/2019   Procedure: EXTERNAL FIXATION PELVIS;  Surgeon: Shona Needles, MD;  Location: [facility];  Service: Orthopedics;  Laterality: N/A;   LAPAROSCOPIC CHOLECYSTECTOMY  05/2016   LITHOTRIPSY  12/16/2018   Procedure: LITHOTRIPSY WITH BASKET;  Surgeon: Rogene Houston, MD;  [facility];  Service: Endoscopy;;   REMOVAL OF STONES N/A 12/16/2018   Procedure: REMOVAL OF STONES WITH BASKET AND STONE BALLOON EXTRACTOR;  Surgeon: Rogene Houston, MD;  [facility];  Service:  Endoscopy;  Laterality: N/A;   SPHINCTEROTOMY N/A 12/16/2018   Procedure: SPHINCTEROTOMY;  Surgeon: Rogene Houston, MD;  [facility];  Service: Endoscopy;  Laterality: N/A;    Family History  Problem Relation Age of Onset   COPD Mother    Lung cancer Father    Colon cancer Neg Hx    Colon polyps Neg Hx     Social History:  reports that she quit smoking about 24 years ago. Her smoking use included cigarettes. She has a 20.00 pack-year smoking history. She has never used smokeless tobacco. She reports that she does not drink alcohol and does not use drugs.  Allergies:  Allergies  Allergen Reactions   Morphine And Related Shortness Of Breath    Oxygen level dropped.     Medications: I have reviewed the patient's current medications.  ROS: Constitutional: No fever or chills Vision: No changes in vision ENT: No difficulty swallowing CV: No chest pain Pulm: No SOB or wheezing GI: No nausea or vomiting GU: No urgency or inability to hold urine Skin: No poor wound healing Neurologic: No numbness or tingling Psychiatric: No depression or anxiety Heme: No bruising Allergic: No reaction to medications or food   Exam: There were no vitals taken for this visit. General: No acute distress Orientation: Awake alert and oriented x3 Mood and Affect: Cooperative and pleasant Gait: Slightly antalgic gait Coordination and balance: Within normal limits  Pelvis and  left lower extremity reveals well-healed incision.  Good motor and sensory function.  Pain with ambulation as well as figure 4 manipulation of the leg.    Medical Decision Making: Data: Imaging: X-rays show stable appearance of fixation there is some developing sclerosis at the left SI joint  Labs: No results found for this or any previous visit (from the past 24 hour(s)).   Imaging or Labs ordered: None   Medical history and chart was reviewed and case discussed with medical  provider.  Assessment/Plan: 70 year old female status post pelvic fixation with continued persistent pain and difficulty with ambulation.  Recommended proceeding with removal of pelvic hardware.  Her pain and location of her pain is consistent with SI joint issues and dysfunction I discussed risks and benefits with the patient.  Risks included but not limited to bleeding, infection, persistent pain, the need for possible injections, and even the possibility anesthetic complications.  The patient agreed to proceed with surgery and consent was obtained.Shona Needles, MD [facility] [phone] (office) [email or web address]

## 2020-11-10 NOTE — Anesthesia Procedure Notes (Signed)
Procedure Name: Intubation Date/Time: 11/10/2020 10:44 AM Performed by: Barrington Ellison, CRNA Pre-anesthesia Checklist: Patient identified, Emergency Drugs available, Suction available and Patient being monitored Patient Re-evaluated:Patient Re-evaluated prior to induction Oxygen Delivery Method: Circle System Utilized Preoxygenation: Pre-oxygenation with 100% oxygen Induction Type: IV induction Ventilation: Mask ventilation without difficulty Laryngoscope Size: Miller and 2 Grade View: Grade I Tube type: Oral Tube size: 7.0 mm Number of attempts: 1 Airway Equipment and Method: Stylet and Oral airway Placement Confirmation: ETT inserted through vocal cords under direct vision, positive ETCO2 and breath sounds checked- equal and bilateral Tube secured with: Tape Dental Injury: Teeth and Oropharynx as per pre-operative assessment

## 2020-11-10 NOTE — Op Note (Signed)
Orthopaedic Surgery Operative Note (CSN: [id number] ) Date of Surgery: 11/10/2020  Admit Date: 11/10/2020   Diagnoses: Pre-Op Diagnoses: Painful orthopaedic hardware follow pelvic fixation  Post-Op Diagnosis: Same  Procedures: CPT 20680-Removal of hardware from pelvis  Surgeons : Primary: Shona Needles, MD  Assistant: Izola Price, RNFA  Location: OR 7   Anesthesia:General   Antibiotics: Ancef 2g preop   Tourniquet time:None  Estimated Blood Loss:Minimal  Complications:* No complications entered in OR log *   Specimens:* No specimens in log *   Implants: * No implants in log *   Indications for Surgery: 70 year old female who had an open book pelvic ring injury in August 2021.  I performed open reduction internal fixation of the pubic symphysis and percutaneous fixation of posterior pelvic ring.  She subsequently developed posterior pelvic pain.  This did not improve with nonoperative management.  I recommend proceeding with removal of posterior pelvic screws.  Risks and benefits were discussed with the patient.  She agreed to proceed with surgery and consent was obtained.  Operative Findings: Successful removal of transsacral transiliac screws   Procedure: The patient was identified in the preoperative holding area. Consent was confirmed with the patient and their family and all questions were answered. The operative extremity was marked after confirmation with the patient. she was then brought back to the operating room by our anesthesia colleagues.  She was placed under general anesthetic.  The left hemipelvis was prepped and draped in usual sterile fashion.  Timeout was performed to verify the patient, the procedure, and the extremity.  Preoperative antibiotics were dosed.  Fluoroscopic imaging showed the pelvic fixation.  An incision was made through the previous scar.  I used a 2.8 mm guidewire to identify the transsacral transiliac screws.  I was able to cannulate  the screws with the guidewire.  I then removed both of the screws without difficulty.  I was able to Grasp one of the washers with a pituitary rondure.  The other washer was encased in scar tissue and it was not able to be removed without significant damage to the soft tissues.  Final fluoroscopic imaging was obtained.  The incision was copiously irrigated.  The incision was closed with 3-0 Monocryl and Dermabond.  A sterile dressing was applied.  The patient was awoke from anesthesia and taken to the PACU in stable condition.  Post Op Plan/Instructions: Patient will be weightbearing as tolerated to the left lower extremity.  She will not need DVT prophylaxis.  She will be discharged home from the PACU.  I was present and performed the entire surgery.  Katha Hamming, MD [facility]

## 2020-11-10 NOTE — Anesthesia Preprocedure Evaluation (Addendum)
Anesthesia Evaluation  Patient identified by MRN, date of birth, ID band Patient awake    Reviewed: Allergy & Precautions, NPO status , Patient's Chart, lab work & pertinent test results  History of Anesthesia Complications (+) PONV  Airway Mallampati: II  TM Distance: >3 FB Neck ROM: Full    Dental  (+) Edentulous Lower, Edentulous Upper, Lower Dentures, Upper Dentures   Pulmonary neg pulmonary ROS, former smoker,    Pulmonary exam normal breath sounds clear to auscultation       Cardiovascular negative cardio ROS Normal cardiovascular exam Rhythm:Regular Rate:Normal     Neuro/Psych PSYCHIATRIC DISORDERS Anxiety Depression negative neurological ROS     GI/Hepatic Neg liver ROS, GERD  Medicated and Controlled,  Endo/Other  Hypothyroidism   Renal/GU negative Renal ROS  negative genitourinary   Musculoskeletal  (+) Arthritis ,   Abdominal   Peds  Hematology negative hematology ROS (+)   Anesthesia Other Findings 70 y.o. female who presents for removal of posterior pelvic fixation.  Patient had an open pelvic ring injury in August 2021.  She underwent open reduction internal fixation of her symphysis as well as percutaneous fixation of the posterior pelvis.   Reproductive/Obstetrics                            Anesthesia Physical Anesthesia Plan  ASA: 2  Anesthesia Plan: General   Post-op Pain Management:    Induction: Intravenous  PONV Risk Score and Plan: 4 or greater and Midazolam, Dexamethasone, Ondansetron, Aprepitant and Scopolamine patch - Pre-op  Airway Management Planned: Oral ETT  Additional Equipment:   Intra-op Plan:   Post-operative Plan: Extubation in OR  Informed Consent: I have reviewed the patients History and Physical, chart, labs and discussed the procedure including the risks, benefits and alternatives for the proposed anesthesia with the patient or authorized  representative who has indicated his/her understanding and acceptance.     Dental advisory given  Plan Discussed with: CRNA  Anesthesia Plan Comments:        Anesthesia Quick Evaluation

## 2020-11-10 NOTE — Discharge Instructions (Addendum)
Orthopaedic Trauma Service Discharge Instructions   General Discharge Instructions  WEIGHT BEARING STATUS:Weightbearing as tolerated  RANGE OF MOTION/ACTIVITY:You may return to your activities as tolerated. You may be sore but there are no restrictions  Wound Care:You may remove your dressing in 48 hours and then leave the incision open to air. You may shower once the dressing comes off.  DVT/PE prophylaxis:None needed  Diet: as you were eating previously.  Can use over the counter stool softeners and bowel preparations, such as Miralax, to help with bowel movements.  Narcotics can be constipating.  Be sure to drink plenty of fluids  PAIN MEDICATION USE AND EXPECTATIONS  You have likely been given narcotic medications to help control your pain.  After a traumatic event that results in an fracture (broken bone) with or without surgery, it is ok to use narcotic pain medications to help control one's pain.  We understand that everyone responds to pain differently and each individual patient will be evaluated on a regular basis for the continued need for narcotic medications. Ideally, narcotic medication use should last no more than 6-8 weeks (coinciding with fracture healing).   As a patient it is your responsibility as well to monitor narcotic medication use and report the amount and frequency you use these medications when you come to your office visit.   We would also advise that if you are using narcotic medications, you should take a dose prior to therapy to maximize you participation.  IF YOU ARE ON NARCOTIC MEDICATIONS IT IS NOT PERMISSIBLE TO OPERATE A MOTOR VEHICLE (MOTORCYCLE/CAR/TRUCK/MOPED) OR HEAVY MACHINERY DO NOT MIX NARCOTICS WITH OTHER CNS (CENTRAL NERVOUS SYSTEM) DEPRESSANTS SUCH AS ALCOHOL   POST-OPERATIVE OPIOID TAPER INSTRUCTIONS: It is important to wean off of your opioid medication as soon as possible. If you do not need pain medication after your surgery it is ok to  stop day one. Opioids include: Codeine, Hydrocodone(Norco, Vicodin), Oxycodone(Percocet, oxycontin) and hydromorphone amongst others.  Long term and even short term use of opiods can cause: Increased pain response Dependence Constipation Depression Respiratory depression And more.  Withdrawal symptoms can include Flu like symptoms Nausea, vomiting And more Techniques to manage these symptoms Hydrate well Eat regular healthy meals Stay active Use relaxation techniques(deep breathing, meditating, yoga) Do Not substitute Alcohol to help with tapering If you have been on opioids for less than two weeks and do not have pain than it is ok to stop all together.  Plan to wean off of opioids This plan should start within one week post op of your fracture surgery  Maintain the same interval or time between taking each dose and first decrease the dose.  Cut the total daily intake of opioids by one tablet each day Next start to increase the time between doses. The last dose that should be eliminated is the evening dose.    STOP SMOKING OR USING NICOTINE PRODUCTS!!!!  As discussed nicotine severely impairs your body's ability to heal surgical and traumatic wounds but also impairs bone healing.  Wounds and bone heal by forming microscopic blood vessels (angiogenesis) and nicotine is a vasoconstrictor (essentially, shrinks blood vessels).  Therefore, if vasoconstriction occurs to these microscopic blood vessels they essentially disappear and are unable to deliver necessary nutrients to the healing tissue.  This is one modifiable factor that you can do to dramatically increase your chances of healing your injury.    (This means no smoking, no nicotine gum, patches, etc)  DO NOT USE NONSTEROIDAL ANTI-INFLAMMATORY  DRUGS (NSAID'S)  Using products such as Advil (ibuprofen), Aleve (naproxen), Motrin (ibuprofen) for additional pain control during fracture healing can delay and/or prevent the healing  response.  If you would like to take over the counter (OTC) medication, Tylenol (acetaminophen) is ok.  However, some narcotic medications that are given for pain control contain acetaminophen as well. Therefore, you should not exceed more than 4000 mg of tylenol in a day if you do not have liver disease.  Also note that there are may OTC medicines, such as cold medicines and allergy medicines that my contain tylenol as well.  If you have any questions about medications and/or interactions please ask your doctor/PA or your pharmacist.       Call office for the following: Temperature greater than 101F Persistent nausea and vomiting Severe uncontrolled pain Redness, tenderness, or signs of infection (pain, swelling, redness, odor or green/yellow discharge around the site) Difficulty breathing, headache or visual disturbances Hives Persistent dizziness or light-headedness Extreme fatigue Any other questions or concerns you may have after discharge  In an emergency, call 911 or go to an Emergency Department at a nearby hospital  HELPFUL INFORMATION  If you had a block, it will wear off between 8-24 hrs postop typically.  This is period when your pain may go from nearly zero to the pain you would have had postop without the block.  This is an abrupt transition but nothing dangerous is happening.  You may take an extra dose of narcotic when this happens.  You should wean off your narcotic medicines as soon as you are able.  Most patients will be off or using minimal narcotics before their first postop appointment.   We suggest you use the pain medication the first night prior to going to bed, in order to ease any pain when the anesthesia wears off. You should avoid taking pain medications on an empty stomach as it will make you nauseous.  Do not drink alcoholic beverages or take illicit drugs when taking pain medications.  In most states it is against the law to drive while you are in a splint  or sling.  And certainly against the law to drive while taking narcotics.  You may return to work/school in the next couple of days when you feel up to it.   Pain medication may make you constipated.  Below are a few solutions to try in this order: Decrease the amount of pain medication if you aren't having pain. Drink lots of decaffeinated fluids. Drink prune juice and/or each dried prunes  If the first 3 don't work start with additional solutions Take Colace - an over-the-counter stool softener Take Senokot - an over-the-counter laxative Take Miralax - a stronger over-the-counter laxative     CALL THE OFFICE WITH ANY QUESTIONS OR CONCERNS: [phone]   VISIT OUR WEBSITE FOR ADDITIONAL INFORMATION: [email or web address]

## 2020-11-11 ENCOUNTER — Encounter (HOSPITAL_COMMUNITY): Payer: Self-pay | Admitting: Student

## 2020-11-11 NOTE — Anesthesia Postprocedure Evaluation (Signed)
Anesthesia Post Note  Patient: Katherine Pope  Procedure(s) Performed: HARDWARE REMOVAL FROM PELVIS (Left: Pelvis)     Patient location during evaluation: PACU Anesthesia Type: General Level of consciousness: awake and alert Pain management: pain level controlled Vital Signs Assessment: post-procedure vital signs reviewed and stable Respiratory status: spontaneous breathing, nonlabored ventilation, respiratory function stable and patient connected to nasal cannula oxygen Cardiovascular status: blood pressure returned to baseline and stable Postop Assessment: no apparent nausea or vomiting Anesthetic complications: no   No notable events documented.  Last Vitals:  Vitals:   11/10/20 1259 11/10/20 1310  BP: 140/77   Pulse: 60 62  Resp: 12 12  Temp:  (!) 36.2 C  SpO2: 100% 95%    Last Pain:  Vitals:   11/10/20 1259  TempSrc:   PainSc: 4                  Alvis Pulcini L Teneisha Gignac

## 2020-11-15 ENCOUNTER — Ambulatory Visit (INDEPENDENT_AMBULATORY_CARE_PROVIDER_SITE_OTHER): Payer: Medicare HMO

## 2020-11-15 ENCOUNTER — Other Ambulatory Visit: Payer: Self-pay

## 2020-11-15 DIAGNOSIS — R32 Unspecified urinary incontinence: Secondary | ICD-10-CM

## 2020-11-15 NOTE — Progress Notes (Signed)
PTNS  Session # 1 of 12  Health & Social Factors: none Caffeine: 2 cups Alcohol: none Daytime voids #per day: 4 Night-time voids #per night: 2 Urgency: tes Incontinence Episodes #per day: all the time Ankle used:  right Treatment Setting: 8 Feeling/ Response: tingling ache Comments: none  Performed By: Estill Bamberg  Assistant: none  Follow Up: 1 week NV

## 2020-11-21 DIAGNOSIS — E7800 Pure hypercholesterolemia, unspecified: Secondary | ICD-10-CM | POA: Diagnosis not present

## 2020-11-21 DIAGNOSIS — E039 Hypothyroidism, unspecified: Secondary | ICD-10-CM | POA: Diagnosis not present

## 2020-11-22 ENCOUNTER — Ambulatory Visit (INDEPENDENT_AMBULATORY_CARE_PROVIDER_SITE_OTHER): Payer: Medicare HMO

## 2020-11-22 ENCOUNTER — Other Ambulatory Visit: Payer: Self-pay

## 2020-11-22 DIAGNOSIS — R32 Unspecified urinary incontinence: Secondary | ICD-10-CM

## 2020-11-22 NOTE — Progress Notes (Signed)
PTNS  Session # 2  Health & Social Factors: none Caffeine: 2 cups Alcohol: no Daytime voids #per day: 5 Night-time voids #per night: 2 Urgency: no Incontinence Episodes #per day: all the time Ankle used: right Treatment Setting: 5 Feeling/ Response: throbbing tingling Comments: none  Performed By: Estill Bamberg RN  Assistant: none  Follow Up: 1 week NV

## 2020-11-29 ENCOUNTER — Ambulatory Visit: Payer: Medicare HMO

## 2020-12-06 ENCOUNTER — Ambulatory Visit (INDEPENDENT_AMBULATORY_CARE_PROVIDER_SITE_OTHER): Payer: Medicare HMO

## 2020-12-06 ENCOUNTER — Other Ambulatory Visit: Payer: Self-pay

## 2020-12-06 DIAGNOSIS — R32 Unspecified urinary incontinence: Secondary | ICD-10-CM | POA: Diagnosis not present

## 2020-12-06 NOTE — Progress Notes (Signed)
PTNS  Session # 4 Health & Social Factors: none Caffeine: 2 cups Alcohol: none Daytime voids #per day: 4 Night-time voids #per night: 2 Urgency: no Incontinence Episodes #per day: yes Ankle used: right Treatment Setting: 5 Feeling/ Response: heel throbbing Comments: none  Performed By: Estill Bamberg RN  Assistant: none  Follow Up: 1 week NV

## 2020-12-07 DIAGNOSIS — T8484XD Pain due to internal orthopedic prosthetic devices, implants and grafts, subsequent encounter: Secondary | ICD-10-CM | POA: Diagnosis not present

## 2020-12-14 ENCOUNTER — Other Ambulatory Visit: Payer: Self-pay

## 2020-12-14 ENCOUNTER — Ambulatory Visit (INDEPENDENT_AMBULATORY_CARE_PROVIDER_SITE_OTHER): Payer: Medicare HMO

## 2020-12-14 DIAGNOSIS — R32 Unspecified urinary incontinence: Secondary | ICD-10-CM

## 2020-12-14 NOTE — Progress Notes (Signed)
PTNS  Session # 5 of 12  Health & Social Factors: none Caffeine: 2 cups Alcohol: none Daytime voids #per day: 4 Night-time voids #per night: none Urgency: no Incontinence Episodes #per day: all the time Ankle used: right Treatment Setting: 5 Feeling/ Response: positive sensation going up leg Comments: none  Performed By: Lucianne Lei  Assistant: n/a  Follow Up: keep next scheduled NV

## 2020-12-21 ENCOUNTER — Other Ambulatory Visit: Payer: Self-pay

## 2020-12-21 ENCOUNTER — Ambulatory Visit (INDEPENDENT_AMBULATORY_CARE_PROVIDER_SITE_OTHER): Payer: Medicare HMO

## 2020-12-21 DIAGNOSIS — R32 Unspecified urinary incontinence: Secondary | ICD-10-CM

## 2020-12-21 NOTE — Progress Notes (Signed)
PTNS  Session # 6 of 12  Health & Social Factors: none Caffeine: 2 cups Alcohol: none Daytime voids #per day: 5 Night-time voids #per night: none Urgency: none Incontinence Episodes #per day: all the time Ankle used: right Treatment Setting: 4 Feeling/ Response: positive sensation from heel at to toe Comments: n/a  Performed By: [facility] LPN  Assistant: none  Follow Up: keep next scheduled NV

## 2020-12-28 ENCOUNTER — Other Ambulatory Visit: Payer: Self-pay

## 2020-12-28 ENCOUNTER — Ambulatory Visit (INDEPENDENT_AMBULATORY_CARE_PROVIDER_SITE_OTHER): Payer: Medicare HMO

## 2020-12-28 DIAGNOSIS — R32 Unspecified urinary incontinence: Secondary | ICD-10-CM | POA: Diagnosis not present

## 2020-12-28 NOTE — Progress Notes (Signed)
PTNS  Session # 7 of 12  Health & Social Factors: none Caffeine: 2 cups Alcohol: none Daytime voids #per day: 4 times Night-time voids #per night: none Urgency: some Incontinence Episodes #per day: all the time Ankle used: right Treatment Setting: 4 Feeling/ Response: positive sensation from heel to toe Comments: n/a  Performed By: [facility] LPN  Assistant: none  Follow Up: Keep next scheduled appointment

## 2021-01-03 ENCOUNTER — Ambulatory Visit (INDEPENDENT_AMBULATORY_CARE_PROVIDER_SITE_OTHER): Payer: Medicare HMO

## 2021-01-03 ENCOUNTER — Other Ambulatory Visit: Payer: Self-pay

## 2021-01-03 DIAGNOSIS — R32 Unspecified urinary incontinence: Secondary | ICD-10-CM | POA: Diagnosis not present

## 2021-01-03 NOTE — Progress Notes (Signed)
PTNS  Session # 8  Health & Social Factors: none Caffeine: 2 cups coffee Alcohol: none Daytime voids #per day: 5 Night-time voids #per night: 1 Urgency: none Incontinence Episodes #per day: all the time Ankle used: right Treatment Setting: 5 Feeling/ Response: tingling sensation  Comments:   Performed By: Estill Bamberg RN  Assistant: none  Follow Up: 1 week PTNS

## 2021-01-10 ENCOUNTER — Ambulatory Visit: Payer: Medicare HMO

## 2021-01-17 ENCOUNTER — Other Ambulatory Visit: Payer: Self-pay

## 2021-01-17 ENCOUNTER — Ambulatory Visit (INDEPENDENT_AMBULATORY_CARE_PROVIDER_SITE_OTHER): Payer: Medicare HMO

## 2021-01-17 DIAGNOSIS — R32 Unspecified urinary incontinence: Secondary | ICD-10-CM

## 2021-01-17 NOTE — Progress Notes (Signed)
PTNS  Session # 9 of 12  Health & Social Factors: none Caffeine: 2 cups coffee Alcohol: none Daytime voids #per day: 4-5 Night-time voids #per night: 2 Urgency: yes Incontinence Episodes #per day: yes Ankle used: right Treatment Setting: 5 Feeling/ Response: tingling sensation Comments: none  Performed By: Estill Bamberg RN  Assistant:   Follow Up: weekly PTNS

## 2021-01-21 DIAGNOSIS — E039 Hypothyroidism, unspecified: Secondary | ICD-10-CM | POA: Diagnosis not present

## 2021-01-21 DIAGNOSIS — R300 Dysuria: Secondary | ICD-10-CM | POA: Diagnosis not present

## 2021-01-24 ENCOUNTER — Ambulatory Visit: Payer: Medicare HMO

## 2021-01-25 DIAGNOSIS — Z299 Encounter for prophylactic measures, unspecified: Secondary | ICD-10-CM | POA: Diagnosis not present

## 2021-01-25 DIAGNOSIS — R32 Unspecified urinary incontinence: Secondary | ICD-10-CM | POA: Diagnosis not present

## 2021-01-25 DIAGNOSIS — M25551 Pain in right hip: Secondary | ICD-10-CM | POA: Diagnosis not present

## 2021-01-25 DIAGNOSIS — N390 Urinary tract infection, site not specified: Secondary | ICD-10-CM | POA: Diagnosis not present

## 2021-01-25 DIAGNOSIS — F419 Anxiety disorder, unspecified: Secondary | ICD-10-CM | POA: Diagnosis not present

## 2021-01-31 ENCOUNTER — Ambulatory Visit: Payer: Medicare HMO

## 2021-02-01 ENCOUNTER — Ambulatory Visit: Payer: Medicare HMO

## 2021-02-07 ENCOUNTER — Other Ambulatory Visit: Payer: Self-pay

## 2021-02-07 ENCOUNTER — Ambulatory Visit (INDEPENDENT_AMBULATORY_CARE_PROVIDER_SITE_OTHER): Payer: Medicare HMO

## 2021-02-07 DIAGNOSIS — R32 Unspecified urinary incontinence: Secondary | ICD-10-CM

## 2021-02-07 NOTE — Progress Notes (Signed)
PTNS  Session # 10 of 12  Health & Social Factors: none Caffeine: 3 Alcohol: none Daytime voids #per day: 4 Night-time voids #per night: 0 Urgency: none Incontinence Episodes #per day: all the time Ankle used: right Treatment Setting: 4 Feeling/ Response: positive sensation from heel to toe Comments: n/a  Performed By: [facility] LPN  Follow Up: Keep next scheduled NV

## 2021-02-15 ENCOUNTER — Other Ambulatory Visit: Payer: Self-pay

## 2021-02-15 ENCOUNTER — Ambulatory Visit (INDEPENDENT_AMBULATORY_CARE_PROVIDER_SITE_OTHER): Payer: Medicare HMO

## 2021-02-15 DIAGNOSIS — R109 Unspecified abdominal pain: Secondary | ICD-10-CM | POA: Diagnosis not present

## 2021-02-15 DIAGNOSIS — I700 Atherosclerosis of aorta: Secondary | ICD-10-CM | POA: Diagnosis not present

## 2021-02-15 DIAGNOSIS — Z6832 Body mass index (BMI) 32.0-32.9, adult: Secondary | ICD-10-CM | POA: Diagnosis not present

## 2021-02-15 DIAGNOSIS — B029 Zoster without complications: Secondary | ICD-10-CM | POA: Diagnosis not present

## 2021-02-15 DIAGNOSIS — R32 Unspecified urinary incontinence: Secondary | ICD-10-CM

## 2021-02-15 DIAGNOSIS — Z299 Encounter for prophylactic measures, unspecified: Secondary | ICD-10-CM | POA: Diagnosis not present

## 2021-02-15 DIAGNOSIS — N1831 Chronic kidney disease, stage 3a: Secondary | ICD-10-CM | POA: Diagnosis not present

## 2021-02-15 NOTE — Progress Notes (Signed)
PTNS  Session # 11 of 12  Health & Social Factors: none Caffeine: 3 Alcohol: none Daytime voids #per day: 4 Night-time voids #per night: 2 Urgency: some Incontinence Episodes #per day: all the time Ankle used: right Treatment Setting: 3 Feeling/ Response: positive sensation Comments:   Performed By: [facility] LPN  Follow Up: Keep next scheduled NV

## 2021-02-22 ENCOUNTER — Ambulatory Visit (INDEPENDENT_AMBULATORY_CARE_PROVIDER_SITE_OTHER): Payer: Medicare HMO

## 2021-02-22 DIAGNOSIS — R32 Unspecified urinary incontinence: Secondary | ICD-10-CM | POA: Diagnosis not present

## 2021-02-22 NOTE — Progress Notes (Signed)
PTNS  Session # 12 of 45  Health & Social Factors: none Caffeine: 4 Alcohol: 0 Daytime voids #per day: 5 Night-time voids #per night: 2 Urgency: sometimes Incontinence Episodes #per day: all the time Ankle used: right Treatment Setting: 3 Feeling/ Response: positive Comments: n/a  Performed By: [facility] LPN  Follow Up: Keep next scheduled OV

## 2021-02-28 ENCOUNTER — Ambulatory Visit: Payer: Medicare HMO | Admitting: Urology

## 2021-03-16 ENCOUNTER — Other Ambulatory Visit: Payer: Self-pay

## 2021-03-16 ENCOUNTER — Encounter: Payer: Self-pay | Admitting: Urology

## 2021-03-16 ENCOUNTER — Ambulatory Visit (INDEPENDENT_AMBULATORY_CARE_PROVIDER_SITE_OTHER): Payer: Medicare HMO | Admitting: Urology

## 2021-03-16 VITALS — BP 131/72 | HR 74

## 2021-03-16 DIAGNOSIS — R32 Unspecified urinary incontinence: Secondary | ICD-10-CM | POA: Diagnosis not present

## 2021-03-16 LAB — URINALYSIS, ROUTINE W REFLEX MICROSCOPIC
Bilirubin, UA: NEGATIVE
Glucose, UA: NEGATIVE
Ketones, UA: NEGATIVE
Leukocytes,UA: NEGATIVE
Nitrite, UA: NEGATIVE
Protein,UA: NEGATIVE
RBC, UA: NEGATIVE
Specific Gravity, UA: 1.01 (ref 1.005–1.030)
Urobilinogen, Ur: 0.2 mg/dL (ref 0.2–1.0)
pH, UA: 5.5 (ref 5.0–7.5)

## 2021-03-16 NOTE — H&P (View-Only) (Signed)
03/16/2021 1:34 PM   [name] [name] 03/10/1951 [id number]  Referring provider: Medicine, [facility] Internal [place],  [place] [address]  Followup urinary incontinence   HPI: Ms Collazos is a 70yo here for followup for urge incontinence. She completed 12 week course of PTNS and noted no improvement in her urge incontinence. She uses 5-6 pads per day. She is unhappy with her incontinence. She has mild SUI.    PMH: Past Medical History:  Diagnosis Date   Anxiety    Arthritis    Cancer ([place])    Cervical - age 70   Chronic back pain    COVID-19 2020   Depression    GERD (gastroesophageal reflux disease)    Hypercholesteremia    Hypothyroidism    PONV (postoperative nausea and vomiting)    after pelvis surgery - 7 hour surgery    Surgical History: Past Surgical History:  Procedure Laterality Date   ABDOMINAL HYSTERECTOMY     CHOLECYSTECTOMY     ERCP N/A 12/16/2018   Procedure: ENDOSCOPIC RETROGRADE CHOLANGIOPANCREATOGRAPHY (ERCP);  Surgeon: Rogene Houston, MD;  [facility];  Service: Endoscopy;  Laterality: N/A;   EXTERNAL FIXATION PELVIS  12/03/2019    EXTERNAL FIXATION PELVIS (N/A )   EXTERNAL FIXATION PELVIS N/A 12/03/2019   Procedure: EXTERNAL FIXATION PELVIS;  Surgeon: Shona Needles, MD;  Location: [place];  Service: Orthopedics;  Laterality: N/A;   HARDWARE REMOVAL Left 11/10/2020   Procedure: HARDWARE REMOVAL FROM PELVIS;  Surgeon: Shona Needles, MD;  Location: [place];  Service: Orthopedics;  Laterality: Left;   LAPAROSCOPIC CHOLECYSTECTOMY  05/2016   LITHOTRIPSY  12/16/2018   Procedure: LITHOTRIPSY WITH BASKET;  Surgeon: Rogene Houston, MD;  [facility];  Service: Endoscopy;;   REMOVAL OF STONES N/A 12/16/2018   Procedure: REMOVAL OF STONES WITH BASKET AND STONE BALLOON EXTRACTOR;  Surgeon: Rogene Houston, MD;  [facility];  Service: Endoscopy;  Laterality: N/A;   SPHINCTEROTOMY N/A 12/16/2018   Procedure: SPHINCTEROTOMY;   Surgeon: Rogene Houston, MD;  [facility];  Service: Endoscopy;  Laterality: N/A;    Home Medications:  Allergies as of 03/16/2021       Reactions   Morphine And Related Shortness Of Breath   Oxygen level dropped.         Medication List        Accurate as of March 16, 2021  1:34 PM. If you have any questions, ask your nurse or doctor.          ARTHRITIS PAIN RELIEF RUB EX Apply 1 application topically daily as needed (pain).   BC Fast Pain Relief Arthritis 1000-65 MG Pack Generic drug: Aspirin-Caffeine Take 1 packet by mouth daily as needed (pain).   citalopram 20 MG tablet Commonly known as: CELEXA Take 20 mg by mouth daily.   fluticasone 50 MCG/ACT nasal spray Commonly known as: FLONASE Place 1 spray into both nostrils daily as needed for allergies or rhinitis.   gabapentin 300 MG capsule Commonly known as: NEURONTIN   HYDROcodone-acetaminophen 5-325 MG tablet Commonly known as: NORCO/VICODIN Take 1 tablet by mouth every 6 (six) hours as needed for severe pain.   levothyroxine 88 MCG tablet Commonly known as: SYNTHROID Take 88 mcg by mouth daily.   LORazepam 0.5 MG tablet Commonly known as: ATIVAN Take 1-2 tablets (0.5-1 mg total) by mouth every 6 (six) hours as needed for anxiety or sleep. What changed:  how  much to take when to take this   meloxicam 15 MG tablet Commonly known as: MOBIC Take 15 mg by mouth daily.   pantoprazole 40 MG tablet Commonly known as: PROTONIX Take 40 mg by mouth daily.        Allergies:  Allergies  Allergen Reactions   Morphine And Related Shortness Of Breath    Oxygen level dropped.     Family History: Family History  Problem Relation Age of Onset   COPD Mother    Lung cancer Father    Colon cancer Neg Hx    Colon polyps Neg Hx     Social History:  reports that she quit smoking about 24 years ago. Her smoking use included cigarettes. She has a 20.00 pack-year smoking history. She has  never used smokeless tobacco. She reports that she does not drink alcohol and does not use drugs.  ROS: All other review of systems were reviewed and are negative except what is noted above in HPI  Physical Exam: BP 131/72    Pulse 74   Constitutional:  Alert and oriented, No acute distress. HEENT: [place] AT, moist mucus membranes.  Trachea midline, no masses. Cardiovascular: No clubbing, cyanosis, or edema. Respiratory: Normal respiratory effort, no increased work of breathing. GI: Abdomen is soft, nontender, nondistended, no abdominal masses GU: No CVA tenderness.  Lymph: No cervical or inguinal lymphadenopathy. Skin: No rashes, bruises or suspicious lesions. Neurologic: Grossly intact, no focal deficits, moving all 4 extremities. Psychiatric: Normal mood and affect.  Laboratory Data: Lab Results  Component Value Date   WBC 6.8 11/10/2020   HGB 13.3 11/10/2020   HCT 42.5 11/10/2020   MCV 94.7 11/10/2020   PLT 212 11/10/2020    Lab Results  Component Value Date   CREATININE 0.68 12/10/2019    No results found for: PSA  No results found for: TESTOSTERONE  No results found for: HGBA1C  Urinalysis    Component Value Date/Time   COLORURINE YELLOW 12/03/2019 1649   APPEARANCEUR Clear 10/22/2020 1424   LABSPEC >1.030 (H) 12/03/2019 1649   PHURINE 5.0 12/03/2019 1649   GLUCOSEU Negative 10/22/2020 1424   HGBUR LARGE (A) 12/03/2019 1649   BILIRUBINUR Negative 10/22/2020 [place] 12/03/2019 1649   PROTEINUR Negative 10/22/2020 1424   PROTEINUR NEGATIVE 12/03/2019 1649   NITRITE Negative 10/22/2020 1424   NITRITE NEGATIVE 12/03/2019 1649   LEUKOCYTESUR Negative 10/22/2020 1424   LEUKOCYTESUR NEGATIVE 12/03/2019 1649    Lab Results  Component Value Date   LABMICR Comment 10/22/2020   WBCUA None seen 07/23/2020   LABEPIT 0-10 07/23/2020   BACTERIA Few 07/23/2020    Pertinent Imaging:  No results found for this or any previous visit.  No results  found for this or any previous visit.  No results found for this or any previous visit.  No results found for this or any previous visit.  No results found for this or any previous visit.  No results found for this or any previous visit.  No results found for this or any previous visit.  No results found for this or any previous visit.   Assessment & Plan:    1. Urinary incontinence, unspecified type -We discussed intravesical botox versus Interstim and the patient wishes to proceed with intravesical botox. Risks/benefits/alternatives discussed - Urinalysis, Routine w reflex microscopic   No follow-ups on file.  Nicolette Bang, MD  [facility] [place]

## 2021-03-16 NOTE — Progress Notes (Signed)
03/16/2021 1:34 PM   Katherine Pope Katherine Pope January 18, 1951 [id number]  Referring provider: Medicine, [facility] Internal [place],   [id number]  Followup urinary incontinence   HPI: [name] is a 70yo here for followup for urge incontinence. She completed 12 week course of PTNS and noted no improvement in her urge incontinence. She uses 5-6 pads per day. She is unhappy with her incontinence. She has mild SUI.    PMH: Past Medical History:  Diagnosis Date   Anxiety    Arthritis    Cancer ([place])    Cervical - age 58   Chronic back pain    COVID-19 2020   Depression    GERD (gastroesophageal reflux disease)    Hypercholesteremia    Hypothyroidism    PONV (postoperative nausea and vomiting)    after pelvis surgery - 7 hour surgery    Surgical History: Past Surgical History:  Procedure Laterality Date   ABDOMINAL HYSTERECTOMY     CHOLECYSTECTOMY     ERCP N/A 12/16/2018   Procedure: ENDOSCOPIC RETROGRADE CHOLANGIOPANCREATOGRAPHY (ERCP);  Surgeon: Katherine Houston, MD;  [facility];  Service: Endoscopy;  Laterality: N/A;   EXTERNAL FIXATION PELVIS  12/03/2019    EXTERNAL FIXATION PELVIS (N/A )   EXTERNAL FIXATION PELVIS N/A 12/03/2019   Procedure: EXTERNAL FIXATION PELVIS;  Surgeon: Katherine Needles, MD;  Location: [place];  Service: Orthopedics;  Laterality: N/A;   HARDWARE REMOVAL Left 11/10/2020   Procedure: HARDWARE REMOVAL FROM PELVIS;  Surgeon: Katherine Needles, MD;  Location: [place];  Service: Orthopedics;  Laterality: Left;   LAPAROSCOPIC CHOLECYSTECTOMY  05/2016   LITHOTRIPSY  12/16/2018   Procedure: LITHOTRIPSY WITH BASKET;  Surgeon: Katherine Houston, MD;  [facility];  Service: Endoscopy;;   REMOVAL OF STONES N/A 12/16/2018   Procedure: REMOVAL OF STONES WITH BASKET AND STONE BALLOON EXTRACTOR;  Surgeon: Katherine Houston, MD;  [facility];  Service: Endoscopy;  Laterality: N/A;   SPHINCTEROTOMY N/A 12/16/2018   Procedure: SPHINCTEROTOMY;   Surgeon: Katherine Houston, MD;  [facility];  Service: Endoscopy;  Laterality: N/A;    Home Medications:  Allergies as of 03/16/2021       Reactions   Morphine And Related Shortness Of Breath   Oxygen level dropped.         Medication List        Accurate as of March 16, 2021  1:34 PM. If you have any questions, ask your nurse or doctor.          ARTHRITIS PAIN RELIEF RUB EX Apply 1 application topically daily as needed (pain).   BC Fast Pain Relief Arthritis 1000-65 MG Pack Generic drug: Aspirin-Caffeine Take 1 packet by mouth daily as needed (pain).   citalopram 20 MG tablet Commonly known as: CELEXA Take 20 mg by mouth daily.   fluticasone 50 MCG/ACT nasal spray Commonly known as: FLONASE Place 1 spray into both nostrils daily as needed for allergies or rhinitis.   gabapentin 300 MG capsule Commonly known as: NEURONTIN   HYDROcodone-acetaminophen 5-325 MG tablet Commonly known as: NORCO/VICODIN Take 1 tablet by mouth every 6 (six) hours as needed for severe pain.   levothyroxine 88 MCG tablet Commonly known as: SYNTHROID Take 88 mcg by mouth daily.   LORazepam 0.5 MG tablet Commonly known as: ATIVAN Take 1-2 tablets (0.5-1 mg total) by mouth every 6 (six) hours as needed for anxiety or sleep. What changed:  how  much to take when to take this   meloxicam 15 MG tablet Commonly known as: MOBIC Take 15 mg by mouth daily.   pantoprazole 40 MG tablet Commonly known as: PROTONIX Take 40 mg by mouth daily.        Allergies:  Allergies  Allergen Reactions   Morphine And Related Shortness Of Breath    Oxygen level dropped.     Family History: Family History  Problem Relation Age of Onset   COPD Mother    Lung cancer Father    Colon cancer Neg Hx    Colon polyps Neg Hx     Social History:  reports that she quit smoking about 24 years ago. Her smoking use included cigarettes. She has a 20.00 pack-year smoking history. She has  never used smokeless tobacco. She reports that she does not drink alcohol and does not use drugs.  ROS: All other review of systems were reviewed and are negative except what is noted above in HPI  Physical Exam: BP 131/72   Pulse 74   Constitutional:  Alert and oriented, No acute distress. HEENT: Katherine Pope, moist mucus membranes.  Trachea midline, no masses. Cardiovascular: No clubbing, cyanosis, or edema. Respiratory: Normal respiratory effort, no increased work of breathing. GI: Abdomen is soft, nontender, nondistended, no abdominal masses GU: No CVA tenderness.  Lymph: No cervical or inguinal lymphadenopathy. Skin: No rashes, bruises or suspicious lesions. Neurologic: Grossly intact, no focal deficits, moving all 4 extremities. Psychiatric: Normal mood and affect.  Laboratory Data: Lab Results  Component Value Date   WBC 6.8 11/10/2020   HGB 13.3 11/10/2020   HCT 42.5 11/10/2020   MCV 94.7 11/10/2020   PLT 212 11/10/2020    Lab Results  Component Value Date   CREATININE 0.68 12/10/2019    No results found for: PSA  No results found for: TESTOSTERONE  No results found for: HGBA1C  Urinalysis    Component Value Date/Time   COLORURINE YELLOW 12/03/2019 1649   APPEARANCEUR Clear 10/22/2020 1424   LABSPEC >1.030 (H) 12/03/2019 1649   PHURINE 5.0 12/03/2019 1649   GLUCOSEU Negative 10/22/2020 1424   HGBUR LARGE (A) 12/03/2019 1649   BILIRUBINUR Negative 10/22/2020 Katherine Pope 12/03/2019 1649   PROTEINUR Negative 10/22/2020 1424   PROTEINUR NEGATIVE 12/03/2019 1649   NITRITE Negative 10/22/2020 1424   NITRITE NEGATIVE 12/03/2019 1649   LEUKOCYTESUR Negative 10/22/2020 1424   LEUKOCYTESUR NEGATIVE 12/03/2019 1649    Lab Results  Component Value Date   LABMICR Comment 10/22/2020   WBCUA None seen 07/23/2020   LABEPIT 0-10 07/23/2020   BACTERIA Few 07/23/2020    Pertinent Imaging:  No results found for this or any previous visit.  No results  found for this or any previous visit.  No results found for this or any previous visit.  No results found for this or any previous visit.  No results found for this or any previous visit.  No results found for this or any previous visit.  No results found for this or any previous visit.  No results found for this or any previous visit.   Assessment & Plan:    1. Urinary incontinence, unspecified type -We discussed intravesical botox versus Interstim and the patient wishes to proceed with intravesical botox. Risks/benefits/alternatives discussed - Urinalysis, Routine w reflex microscopic   No follow-ups on file.  Nicolette Bang, MD  [facility] [place]

## 2021-03-16 NOTE — Progress Notes (Signed)
Urological Symptom Review  Patient is experiencing the following symptoms: Hard to postpone urination Leakage of urine   Review of Systems  Gastrointestinal (upper)  : Negative for upper GI symptoms  Gastrointestinal (lower) : Negative for lower GI symptoms  Constitutional : Negative for symptoms  Skin: Negative for skin symptoms  Eyes: Negative for eye symptoms  Ear/Nose/Throat : Negative for Ear/Nose/Throat symptoms  Hematologic/Lymphatic: Negative for Hematologic/Lymphatic symptoms  Cardiovascular : Negative for cardiovascular symptoms  Respiratory : Negative for respiratory symptoms  Endocrine: Negative for endocrine symptoms  Musculoskeletal: Back pain Joint pain  Neurological: Negative for neurological symptoms  Psychologic: Depression Anxiety

## 2021-03-16 NOTE — Patient Instructions (Signed)
Botulinum Toxin Bladder Injection A botulinum toxin bladder injection is a procedure to treat an overactive bladder. During the procedure, a drug called botulinum toxin is injected into the bladder through a long, thin needle. This drug relaxes the bladder muscles and reduces overactivity. You may need this procedure if your medicines are not working or you cannot take them. The procedure may be repeated as needed. The treatment is done once and it usually lasts for 6 months. Your health care provider will monitor you to see how well you respond. Tell a health care provider about: Any allergies you have. All medicines you are taking, including vitamins, herbs, eye drops, creams, and over-the-counter medicines. Any problems you or family members have had with anesthetic medicines. Any bleeding problems you have. Any surgeries you have had. Any medical conditions you have. Any previous reactions to a botulinum toxin injection. Any symptoms of urinary tract infection. These include chills, fever, a burning feeling when passing urine, and needing to pass urine often. Whether you are pregnant or may be pregnant. What are the risks? Generally this is a safe procedure. However, problems may occur, including: Not being able to pass urine. If this happens, you may need to have your bladder emptied with a thin tube (urinary catheter). Bleeding. Urinary tract infection. Allergic reaction to the botulinum toxin. Pain or burning when passing urine. Damage to nearby structures or organs. What happens before the procedure? When to stop eating and drinking Follow instructions from your health care provider about what you may eat and drink before your procedure. These may include: 8 hours before the procedure Stop eating most foods. Do not eat meat, fried foods, or fatty foods. Eat only light foods, such as toast or crackers. All liquids are okay except energy drinks and alcohol. 6 hours before the  procedure Stop eating. Drink only clear liquids, such as water, clear fruit juice, black coffee, plain tea, and sports drinks. Do not drink energy drinks or alcohol. 2 hours before the procedure Stop drinking all liquids. You may be allowed to take medicines with small sips of water. If you do not follow your health care provider's instructions, your procedure may be delayed or canceled. Medicines Ask your health care provider about: Changing or stopping your regular medicines. This is especially important if you are taking diabetes medicines or blood thinners. Taking medicines such as aspirin and ibuprofen. These medicines can thin your blood. Do not take these medicines unless your health care provider tells you to take them. Taking over-the-counter medicines, vitamins, herbs, and supplements. General instructions Ask your health care provider what steps will be taken to help prevent infection. These steps may include: Removing hair at the procedure site. Washing skin with a germ-killing soap. Taking antibiotic medicine. If you will be going home right after the procedure, plan to have a responsible adult: Take you home from the hospital or clinic. You will not be allowed to drive. Care for you for the time you are told. What happens during the procedure?  You will be asked to empty your bladder. An IV will be inserted into one of your veins. You will be given one or more of the following: A medicine to help you relax (sedative). A medicine to numb the area (local anesthetic). A medicine to make you fall asleep (general anesthetic). A long, thin scope called a cystoscope will be passed into your bladder through the part of the body that carries urine from your bladder (urethra). The cystoscope will  be used to fill your bladder with water. A long needle will be passed through the cystoscope and into the bladder. The botulinum toxin will be injected into your bladder. It may be  injected into multiple areas of your bladder. The cystoscope will be removed and your bladder will be emptied with a urinary catheter. The procedure may vary among health care providers and hospitals. What can I expect after the procedure? After your procedure, it is common to have: Blood-tinged urine. Burning or soreness when you pass urine. Follow these instructions at home: Medicines Take over-the-counter and prescription medicines only as told by your health care provider. If you were prescribed an antibiotic medicine, take it as told by your health care provider. Do not stop using the antibiotic even if you start to feel better. General instructions  If you were given a sedative during the procedure, it can affect you for several hours. Do not drive or operate machinery until your health care provider says that it is safe. Drink enough fluid to keep your urine pale yellow. Return to your normal activities as told by your health care provider. Ask your health care provider what activities are safe for you. Keep all follow-up visits. Contact a health care provider if you have: A fever or chills. Blood-tinged urine for more than one day after your procedure. Worsening pain or burning when you pass urine. Pain or burning when passing urine for more than two days after your procedure. Trouble emptying your bladder. Get help right away if you: Have bright red blood in your urine. Are unable to pass urine. Summary A botulinum toxin bladder injection is a procedure to treat an overactive bladder. This is generally a safe procedure. However, problems may occur, including not being able to pass urine, bleeding, infection, pain, and an allergic reaction to the botulinum toxin. You will be told when to stop eating and drinking, and what medicines to change or stop. Follow instructions carefully. After the procedure, it is common to have blood in your urine and to have soreness or burning when  passing urine. Contact a health care provider if you have a fever, blood in your urine for more than a few days, or trouble passing urine. Get help right away if you have bright red blood in your urine, or if you are unable to pass urine. This information is not intended to replace advice given to you by your health care provider. Make sure you discuss any questions you have with your health care provider. Document Revised: 10/15/2020 Document Reviewed: 10/15/2020 Elsevier Patient Education  [place].

## 2021-03-23 DIAGNOSIS — E039 Hypothyroidism, unspecified: Secondary | ICD-10-CM | POA: Diagnosis not present

## 2021-03-23 DIAGNOSIS — R300 Dysuria: Secondary | ICD-10-CM | POA: Diagnosis not present

## 2021-03-25 ENCOUNTER — Other Ambulatory Visit: Payer: Self-pay

## 2021-03-25 DIAGNOSIS — N3281 Overactive bladder: Secondary | ICD-10-CM

## 2021-03-25 NOTE — Progress Notes (Signed)
[facility] Surgery Posting Form   Surgery Date/Time: Date: 04/11/2021  Surgeon: Dr. Nicolette Bang, MD  Surgery Location: Day Surgery  Inpt ( No  )   Outpt (Yes)   Obs ( No  )   Diagnosis: Over Active Bladder   -CPT: 46270, J5009  Surgery: Cystoscopy with intravesical Botox 100 units  Stop Anticoagulations: No, May continue ASA  Cardiac/Medical/Pulmonary Clearance needed: no  *Orders entered into EPIC  Date: 03/25/21   *Case booked in [place]  Date: 03/22/2021  *Notified pt of Surgery: Date: 03/22/2021  *Placed into Prior Authorization Work Fabio Bering Date: 03/25/21   Assistant/laser/rep:No

## 2021-04-05 NOTE — Patient Instructions (Signed)
Katherine Pope  04/05/2021     @PREFPERIOPPHARMACY @   Your procedure is scheduled on  04/11/2021.   Report to [facility] at  [phone] A.M.   Call this number if you have problems the morning of surgery:  [phone]   Remember:  Do not eat or drink after midnight.      Take these medicines the morning of surgery with A SIP OF WATER            celexa, gabapentin, levothyroxine, mobic(if needed), protonix.     Do not wear jewelry, make-up or nail polish.  Do not wear lotions, powders, or perfumes, or deodorant.  Do not shave 48 hours prior to surgery.  Men may shave face and neck.  Do not bring valuables to the hospital.  [facility] is not responsible for any belongings or valuables.  Contacts, dentures or bridgework may not be worn into surgery.  Leave your suitcase in the car.  After surgery it may be brought to your room.  For patients admitted to the hospital, discharge time will be determined by your treatment team.  Patients discharged the day of surgery will not be allowed to drive home and must have someone with them for 24 hours.    Special instructions:   DO NOT smoke tobacco or vape for 24 hours before your procedure.  Please read over the following fact sheets that you were given. Coughing and Deep Breathing, Surgical Site Infection Prevention, Anesthesia Post-op Instructions, and Care and Recovery After Surgery      Botulinum Toxin Bladder Injection A botulinum toxin bladder injection is a procedure to treat an overactive bladder. During the procedure, a drug called botulinum toxin is injected into the bladder through a long, thin needle. This drug relaxes the bladder muscles and reduces overactivity. You may need this procedure if your medicines are not working or you cannot take them. The procedure may be repeated as needed. The treatment is done once and it usually lasts for 6 months. Your health care provider will monitor you to see how well you  respond. Tell a health care provider about: Any allergies you have. All medicines you are taking, including vitamins, herbs, eye drops, creams, and over-the-counter medicines. Any problems you or family members have had with anesthetic medicines. Any bleeding problems you have. Any surgeries you have had. Any medical conditions you have. Any previous reactions to a botulinum toxin injection. Any symptoms of urinary tract infection. These include chills, fever, a burning feeling when passing urine, and needing to pass urine often. Whether you are pregnant or may be pregnant. What are the risks? Generally this is a safe procedure. However, problems may occur, including: Not being able to pass urine. If this happens, you may need to have your bladder emptied with a thin tube (urinary catheter). Bleeding. Urinary tract infection. Allergic reaction to the botulinum toxin. Pain or burning when passing urine. Damage to nearby structures or organs. What happens before the procedure? When to stop eating and drinking Follow instructions from your health care provider about what you may eat and drink before your procedure. These may include: 8 hours before the procedure Stop eating most foods. Do not eat meat, fried foods, or fatty foods. Eat only light foods, such as toast or crackers. All liquids are okay except energy drinks and alcohol. 6 hours before the procedure Stop eating. Drink only clear liquids, such as water, clear fruit juice, black  coffee, plain tea, and sports drinks. Do not drink energy drinks or alcohol. 2 hours before the procedure Stop drinking all liquids. You may be allowed to take medicines with small sips of water. If you do not follow your health care provider's instructions, your procedure may be delayed or canceled. Medicines Ask your health care provider about: Changing or stopping your regular medicines. This is especially important if you are taking diabetes  medicines or blood thinners. Taking medicines such as aspirin and ibuprofen. These medicines can thin your blood. Do not take these medicines unless your health care provider tells you to take them. Taking over-the-counter medicines, vitamins, herbs, and supplements. General instructions Ask your health care provider what steps will be taken to help prevent infection. These steps may include: Removing hair at the procedure site. Washing skin with a germ-killing soap. Taking antibiotic medicine. If you will be going home right after the procedure, plan to have a responsible adult: Take you home from the hospital or clinic. You will not be allowed to drive. Care for you for the time you are told. What happens during the procedure?  You will be asked to empty your bladder. An IV will be inserted into one of your veins. You will be given one or more of the following: A medicine to help you relax (sedative). A medicine to numb the area (local anesthetic). A medicine to make you fall asleep (general anesthetic). A long, thin scope called a cystoscope will be passed into your bladder through the part of the body that carries urine from your bladder (urethra). The cystoscope will be used to fill your bladder with water. A long needle will be passed through the cystoscope and into the bladder. The botulinum toxin will be injected into your bladder. It may be injected into multiple areas of your bladder. The cystoscope will be removed and your bladder will be emptied with a urinary catheter. The procedure may vary among health care providers and hospitals. What can I expect after the procedure? After your procedure, it is common to have: Blood-tinged urine. Burning or soreness when you pass urine. Follow these instructions at home: Medicines Take over-the-counter and prescription medicines only as told by your health care provider. If you were prescribed an antibiotic medicine, take it as told  by your health care provider. Do not stop using the antibiotic even if you start to feel better. General instructions  If you were given a sedative during the procedure, it can affect you for several hours. Do not drive or operate machinery until your health care provider says that it is safe. Drink enough fluid to keep your urine pale yellow. Return to your normal activities as told by your health care provider. Ask your health care provider what activities are safe for you. Keep all follow-up visits. Contact a health care provider if you have: A fever or chills. Blood-tinged urine for more than one day after your procedure. Worsening pain or burning when you pass urine. Pain or burning when passing urine for more than two days after your procedure. Trouble emptying your bladder. Get help right away if you: Have bright red blood in your urine. Are unable to pass urine. Summary A botulinum toxin bladder injection is a procedure to treat an overactive bladder. This is generally a safe procedure. However, problems may occur, including not being able to pass urine, bleeding, infection, pain, and an allergic reaction to the botulinum toxin. You will be told when to stop eating  and drinking, and what medicines to change or stop. Follow instructions carefully. After the procedure, it is common to have blood in your urine and to have soreness or burning when passing urine. Contact a health care provider if you have a fever, blood in your urine for more than a few days, or trouble passing urine. Get help right away if you have bright red blood in your urine, or if you are unable to pass urine. This information is not intended to replace advice given to you by your health care provider. Make sure you discuss any questions you have with your health care provider. Document Revised: 10/15/2020 Document Reviewed: 10/15/2020 Elsevier Patient Education  [place] Anesthesia, Adult, Care  After This sheet gives you information about how to care for yourself after your procedure. Your health care provider may also give you more specific instructions. If you have problems or questions, contact your health care provider. What can I expect after the procedure? After the procedure, the following side effects are common: Pain or discomfort at the IV site. Nausea. Vomiting. Sore throat. Trouble concentrating. Feeling cold or chills. Feeling weak or tired. Sleepiness and fatigue. Soreness and body aches. These side effects can affect parts of the body that were not involved in surgery. Follow these instructions at home: For the time period you were told by your health care provider:  Rest. Do not participate in activities where you could fall or become injured. Do not drive or use machinery. Do not drink alcohol. Do not take sleeping pills or medicines that cause drowsiness. Do not make important decisions or sign legal documents. Do not take care of children on your own. Eating and drinking Follow any instructions from your health care provider about eating or drinking restrictions. When you feel hungry, start by eating small amounts of foods that are soft and easy to digest (bland), such as toast. Gradually return to your regular diet. Drink enough fluid to keep your urine pale yellow. If you vomit, rehydrate by drinking water, juice, or clear broth. General instructions If you have sleep apnea, surgery and certain medicines can increase your risk for breathing problems. Follow instructions from your health care provider about wearing your sleep device: Anytime you are sleeping, including during daytime naps. While taking prescription pain medicines, sleeping medicines, or medicines that make you drowsy. Have a responsible adult stay with you for the time you are told. It is important to have someone help care for you until you are awake and alert. Return to your normal  activities as told by your health care provider. Ask your health care provider what activities are safe for you. Take over-the-counter and prescription medicines only as told by your health care provider. If you smoke, do not smoke without supervision. Keep all follow-up visits as told by your health care provider. This is important. Contact a health care provider if: You have nausea or vomiting that does not get better with medicine. You cannot eat or drink without vomiting. You have pain that does not get better with medicine. You are unable to pass urine. You develop a skin rash. You have a fever. You have redness around your IV site that gets worse. Get help right away if: You have difficulty breathing. You have chest pain. You have blood in your urine or stool, or you vomit blood. Summary After the procedure, it is common to have a sore throat or nausea. It is also common to feel tired. Have  a responsible adult stay with you for the time you are told. It is important to have someone help care for you until you are awake and alert. When you feel hungry, start by eating small amounts of foods that are soft and easy to digest (bland), such as toast. Gradually return to your regular diet. Drink enough fluid to keep your urine pale yellow. Return to your normal activities as told by your health care provider. Ask your health care provider what activities are safe for you. This information is not intended to replace advice given to you by your health care provider. Make sure you discuss any questions you have with your health care provider. Document Revised: 12/25/2019 Document Reviewed: 07/24/2019 Elsevier Patient Education  2022 [place]. How to Use Chlorhexidine for Bathing Chlorhexidine gluconate (CHG) is a germ-killing (antiseptic) solution that is used to clean the skin. It can get rid of the bacteria that normally live on the skin and can keep them away for about 24 hours. To  clean your skin with CHG, you may be given: A CHG solution to use in the shower or as part of a sponge bath. A prepackaged cloth that contains CHG. Cleaning your skin with CHG may help lower the risk for infection: While you are staying in the intensive care unit of the hospital. If you have a vascular access, such as a central line, to provide short-term or long-term access to your veins. If you have a catheter to drain urine from your bladder. If you are on a ventilator. A ventilator is a machine that helps you breathe by moving air in and out of your lungs. After surgery. What are the risks? Risks of using CHG include: A skin reaction. Hearing loss, if CHG gets in your ears and you have a perforated eardrum. Eye injury, if CHG gets in your eyes and is not rinsed out. The CHG product catching fire. Make sure that you avoid smoking and flames after applying CHG to your skin. Do not use CHG: If you have a chlorhexidine allergy or have previously reacted to chlorhexidine. On babies younger than 64 months of age. How to use CHG solution Use CHG only as told by your health care provider, and follow the instructions on the label. Use the full amount of CHG as directed. Usually, this is one bottle. During a shower Follow these steps when using CHG solution during a shower (unless your health care provider gives you different instructions): Start the shower. Use your normal soap and shampoo to wash your face and hair. Turn off the shower or move out of the shower stream. Pour the CHG onto a clean washcloth. Do not use any type of brush or rough-edged sponge. Starting at your neck, lather your body down to your toes. Make sure you follow these instructions: If you will be having surgery, pay special attention to the part of your body where you will be having surgery. Scrub this area for at least 1 minute. Do not use CHG on your head or face. If the solution gets into your ears or eyes, rinse  them well with water. Avoid your genital area. Avoid any areas of skin that have broken skin, cuts, or scrapes. Scrub your back and under your arms. Make sure to wash skin folds. Let the lather sit on your skin for 1-2 minutes or as long as told by your health care provider. Thoroughly rinse your entire body in the shower. Make sure that all body  creases and crevices are rinsed well. Dry off with a clean towel. Do not put any substances on your body afterward--such as powder, lotion, or perfume--unless you are told to do so by your health care provider. Only use lotions that are recommended by the manufacturer. Put on clean clothes or pajamas. If it is the night before your surgery, sleep in clean sheets.  During a sponge bath Follow these steps when using CHG solution during a sponge bath (unless your health care provider gives you different instructions): Use your normal soap and shampoo to wash your face and hair. Pour the CHG onto a clean washcloth. Starting at your neck, lather your body down to your toes. Make sure you follow these instructions: If you will be having surgery, pay special attention to the part of your body where you will be having surgery. Scrub this area for at least 1 minute. Do not use CHG on your head or face. If the solution gets into your ears or eyes, rinse them well with water. Avoid your genital area. Avoid any areas of skin that have broken skin, cuts, or scrapes. Scrub your back and under your arms. Make sure to wash skin folds. Let the lather sit on your skin for 1-2 minutes or as long as told by your health care provider. Using a different clean, wet washcloth, thoroughly rinse your entire body. Make sure that all body creases and crevices are rinsed well. Dry off with a clean towel. Do not put any substances on your body afterward--such as powder, lotion, or perfume--unless you are told to do so by your health care provider. Only use lotions that are  recommended by the manufacturer. Put on clean clothes or pajamas. If it is the night before your surgery, sleep in clean sheets. How to use CHG prepackaged cloths Only use CHG cloths as told by your health care provider, and follow the instructions on the label. Use the CHG cloth on clean, dry skin. Do not use the CHG cloth on your head or face unless your health care provider tells you to. When washing with the CHG cloth: Avoid your genital area. Avoid any areas of skin that have broken skin, cuts, or scrapes. Before surgery Follow these steps when using a CHG cloth to clean before surgery (unless your health care provider gives you different instructions): Using the CHG cloth, vigorously scrub the part of your body where you will be having surgery. Scrub using a back-and-forth motion for 3 minutes. The area on your body should be completely wet with CHG when you are done scrubbing. Do not rinse. Discard the cloth and let the area air-dry. Do not put any substances on the area afterward, such as powder, lotion, or perfume. Put on clean clothes or pajamas. If it is the night before your surgery, sleep in clean sheets.  For general bathing Follow these steps when using CHG cloths for general bathing (unless your health care provider gives you different instructions). Use a separate CHG cloth for each area of your body. Make sure you wash between any folds of skin and between your fingers and toes. Wash your body in the following order, switching to a new cloth after each step: The front of your neck, shoulders, and chest. Both of your arms, under your arms, and your hands. Your stomach and groin area, avoiding the genitals. Your right leg and foot. Your left leg and foot. The back of your neck, your back, and your buttocks. Do not  rinse. Discard the cloth and let the area air-dry. Do not put any substances on your body afterward--such as powder, lotion, or perfume--unless you are told to do  so by your health care provider. Only use lotions that are recommended by the manufacturer. Put on clean clothes or pajamas. Contact a health care provider if: Your skin gets irritated after scrubbing. You have questions about using your solution or cloth. You swallow any chlorhexidine. Call your local poison control center (1-(631)481-2484 in the [place] Get help right away if: Your eyes itch badly, or they become very red or swollen. Your skin itches badly and is red or swollen. Your hearing changes. You have trouble seeing. You have swelling or tingling in your mouth or throat. You have trouble breathing. These symptoms may represent a serious problem that is an emergency. Do not wait to see if the symptoms will go away. Get medical help right away. Call your local emergency services (911 in the [place] Do not drive yourself to the hospital. Summary Chlorhexidine gluconate (CHG) is a germ-killing (antiseptic) solution that is used to clean the skin. Cleaning your skin with CHG may help to lower your risk for infection. You may be given CHG to use for bathing. It may be in a bottle or in a prepackaged cloth to use on your skin. Carefully follow your health care provider's instructions and the instructions on the product label. Do not use CHG if you have a chlorhexidine allergy. Contact your health care provider if your skin gets irritated after scrubbing. This information is not intended to replace advice given to you by your health care provider. Make sure you discuss any questions you have with your health care provider. Document Revised: 06/21/2020 Document Reviewed: 06/21/2020 Elsevier Patient Education  2022 [employer].

## 2021-04-07 ENCOUNTER — Encounter (HOSPITAL_COMMUNITY)
Admission: RE | Admit: 2021-04-07 | Discharge: 2021-04-07 | Disposition: A | Discharge status: Home / Self Care | Payer: Medicare HMO | Source: Ambulatory Visit | Attending: Urology | Admitting: Urology

## 2021-04-07 ENCOUNTER — Other Ambulatory Visit: Payer: Self-pay

## 2021-04-07 ENCOUNTER — Encounter (HOSPITAL_COMMUNITY): Payer: Self-pay

## 2021-04-07 DIAGNOSIS — Z01812 Encounter for preprocedural laboratory examination: Secondary | ICD-10-CM | POA: Insufficient documentation

## 2021-04-08 MED ORDER — ONABOTULINUMTOXINA 100 UNITS IJ SOLR
100.0000 [IU] | Freq: Once | INTRAMUSCULAR | Status: DC
  Filled 2021-04-08 (×2): qty 100

## 2021-04-11 ENCOUNTER — Ambulatory Visit (HOSPITAL_COMMUNITY)
Admission: RE | Admit: 2021-04-11 | Discharge: 2021-04-11 | Disposition: A | Discharge status: Home / Self Care | Payer: Medicare HMO | Source: Ambulatory Visit | Attending: Urology | Admitting: Urology

## 2021-04-11 ENCOUNTER — Ambulatory Visit (HOSPITAL_COMMUNITY): Payer: Medicare HMO | Admitting: Anesthesiology

## 2021-04-11 ENCOUNTER — Encounter (HOSPITAL_COMMUNITY): Payer: Self-pay | Admitting: Urology

## 2021-04-11 ENCOUNTER — Encounter (HOSPITAL_COMMUNITY)
Admission: RE | Disposition: A | Discharge status: Home / Self Care | Payer: Self-pay | Source: Ambulatory Visit | Attending: Urology

## 2021-04-11 DIAGNOSIS — F419 Anxiety disorder, unspecified: Secondary | ICD-10-CM | POA: Diagnosis not present

## 2021-04-11 DIAGNOSIS — M1990 Unspecified osteoarthritis, unspecified site: Secondary | ICD-10-CM | POA: Insufficient documentation

## 2021-04-11 DIAGNOSIS — Z87891 Personal history of nicotine dependence: Secondary | ICD-10-CM | POA: Insufficient documentation

## 2021-04-11 DIAGNOSIS — E039 Hypothyroidism, unspecified: Secondary | ICD-10-CM | POA: Diagnosis not present

## 2021-04-11 DIAGNOSIS — K219 Gastro-esophageal reflux disease without esophagitis: Secondary | ICD-10-CM | POA: Insufficient documentation

## 2021-04-11 DIAGNOSIS — F32A Depression, unspecified: Secondary | ICD-10-CM | POA: Diagnosis not present

## 2021-04-11 DIAGNOSIS — N3281 Overactive bladder: Secondary | ICD-10-CM | POA: Diagnosis not present

## 2021-04-11 HISTORY — PX: BOTOX INJECTION: SHX5754

## 2021-04-11 HISTORY — PX: CYSTOSCOPY: SHX5120

## 2021-04-11 SURGERY — CYSTOSCOPY
Anesthesia: General | Site: Bladder

## 2021-04-11 MED ORDER — PROPOFOL 10 MG/ML IV BOLUS
INTRAVENOUS | Status: DC | PRN
  Administered 2021-04-11: 30 mg via INTRAVENOUS

## 2021-04-11 MED ORDER — ORAL CARE MOUTH RINSE: 15.0000 mL | Freq: Once | OROMUCOSAL | Status: AC

## 2021-04-11 MED ORDER — ONDANSETRON HCL 4 MG/2ML IJ SOLN: 4.0000 mg | Freq: Once | INTRAMUSCULAR | Status: DC | PRN

## 2021-04-11 MED ORDER — SODIUM CHLORIDE (PF) 0.9 % IJ SOLN
INTRAMUSCULAR | Status: AC
  Filled 2021-04-11: qty 20

## 2021-04-11 MED ORDER — LACTATED RINGERS IV SOLN: INTRAVENOUS | Status: DC

## 2021-04-11 MED ORDER — SODIUM CHLORIDE (PF) 0.9 % IJ SOLN
INTRAMUSCULAR | Status: DC | PRN
  Administered 2021-04-11: 20 mL

## 2021-04-11 MED ORDER — FENTANYL CITRATE (PF) 100 MCG/2ML IJ SOLN
INTRAMUSCULAR | Status: DC | PRN
  Administered 2021-04-11: 25 ug via INTRAVENOUS
  Administered 2021-04-11: 50 ug via INTRAVENOUS

## 2021-04-11 MED ORDER — CEFAZOLIN SODIUM-DEXTROSE 2-4 GM/100ML-% IV SOLN
2.0000 g | INTRAVENOUS | Status: AC
  Administered 2021-04-11: 08:00:00 2 g via INTRAVENOUS
  Filled 2021-04-11: qty 100

## 2021-04-11 MED ORDER — PHENYLEPHRINE 40 MCG/ML (10ML) SYRINGE FOR IV PUSH (FOR BLOOD PRESSURE SUPPORT)
PREFILLED_SYRINGE | INTRAVENOUS | Status: AC
  Filled 2021-04-11: qty 20

## 2021-04-11 MED ORDER — STERILE WATER FOR IRRIGATION IR SOLN
Status: DC | PRN
  Administered 2021-04-11: 3000 mL

## 2021-04-11 MED ORDER — ONDANSETRON HCL 4 MG/2ML IJ SOLN
INTRAMUSCULAR | Status: AC
  Filled 2021-04-11: qty 2

## 2021-04-11 MED ORDER — DEXAMETHASONE SODIUM PHOSPHATE 10 MG/ML IJ SOLN
INTRAMUSCULAR | Status: AC
  Filled 2021-04-11: qty 1

## 2021-04-11 MED ORDER — FENTANYL CITRATE (PF) 100 MCG/2ML IJ SOLN
INTRAMUSCULAR | Status: AC
  Filled 2021-04-11: qty 2

## 2021-04-11 MED ORDER — LIDOCAINE HCL (PF) 2 % IJ SOLN
INTRAMUSCULAR | Status: AC
  Filled 2021-04-11: qty 5

## 2021-04-11 MED ORDER — LIDOCAINE VISCOUS HCL 2 % MT SOLN
OROMUCOSAL | Status: DC | PRN
  Administered 2021-04-11: 1

## 2021-04-11 MED ORDER — LIDOCAINE VISCOUS HCL 2 % MT SOLN
OROMUCOSAL | Status: AC
  Filled 2021-04-11: qty 15

## 2021-04-11 MED ORDER — EPHEDRINE 5 MG/ML INJ
INTRAVENOUS | Status: AC
  Filled 2021-04-11: qty 20

## 2021-04-11 MED ORDER — PROPOFOL 10 MG/ML IV BOLUS
INTRAVENOUS | Status: AC
  Filled 2021-04-11: qty 20

## 2021-04-11 MED ORDER — PROPOFOL 500 MG/50ML IV EMUL
INTRAVENOUS | Status: DC | PRN
  Administered 2021-04-11: 100 ug/kg/min via INTRAVENOUS

## 2021-04-11 MED ORDER — FENTANYL CITRATE PF 50 MCG/ML IJ SOSY: 25.0000 ug | PREFILLED_SYRINGE | INTRAMUSCULAR | Status: DC | PRN

## 2021-04-11 MED ORDER — CHLORHEXIDINE GLUCONATE 0.12 % MT SOLN
15.0000 mL | Freq: Once | OROMUCOSAL | Status: AC
  Administered 2021-04-11: 07:00:00 15 mL via OROMUCOSAL
  Filled 2021-04-11: qty 15

## 2021-04-11 MED ORDER — ONABOTULINUMTOXINA 100 UNITS IJ SOLR
INTRAMUSCULAR | Status: DC | PRN
  Administered 2021-04-11: 100 [IU] via INTRAMUSCULAR

## 2021-04-11 SURGICAL SUPPLY — 17 items
BAG DRAIN URO TABLE W/ADPT NS (BAG) ×3 IMPLANT
BAG DRN 8 ADPR NS SKTRN CSTL (BAG) ×1
BAG HAMPER (MISCELLANEOUS) ×3 IMPLANT
CLOTH BEACON ORANGE TIMEOUT ST (SAFETY) ×3 IMPLANT
GLOVE SURG POLYISO LF SZ8 (GLOVE) ×3 IMPLANT
GLOVE SURG UNDER POLY LF SZ7 (GLOVE) ×3 IMPLANT
GOWN STRL REUS W/TWL LRG LVL3 (GOWN DISPOSABLE) ×6 IMPLANT
GOWN STRL REUS W/TWL XL LVL3 (GOWN DISPOSABLE) ×3 IMPLANT
KIT TURNOVER CYSTO (KITS) ×3 IMPLANT
MANIFOLD NEPTUNE II (INSTRUMENTS) ×3 IMPLANT
NDL ASPIRATION 22 (NEEDLE) IMPLANT
NEEDLE ASPIRATION 22 (NEEDLE) ×3 IMPLANT
PACK CYSTO (CUSTOM PROCEDURE TRAY) ×3 IMPLANT
PAD ARMBOARD 7.5X6 YLW CONV (MISCELLANEOUS) ×3 IMPLANT
TOWEL NATURAL 4PK STERILE (DISPOSABLE) ×3 IMPLANT
WATER STERILE IRR 3000ML UROMA (IV SOLUTION) ×3 IMPLANT
WATER STERILE IRR 500ML POUR (IV SOLUTION) ×3 IMPLANT

## 2021-04-11 NOTE — Anesthesia Preprocedure Evaluation (Signed)
Anesthesia Evaluation  Patient identified by MRN, date of birth, ID band Patient awake    Reviewed: Allergy & Precautions, NPO status , Patient's Chart, lab work & pertinent test results  History of Anesthesia Complications (+) PONV and history of anesthetic complications  Airway Mallampati: II  TM Distance: >3 FB Neck ROM: Full    Dental  (+) Dental Advisory Given, Upper Dentures, Lower Dentures   Pulmonary former smoker,    Pulmonary exam normal breath sounds clear to auscultation       Cardiovascular Normal cardiovascular exam Rhythm:Regular Rate:Normal     Neuro/Psych PSYCHIATRIC DISORDERS Anxiety Depression    GI/Hepatic Neg liver ROS, GERD  Medicated,  Endo/Other  Hypothyroidism   Renal/GU negative Renal ROS     Musculoskeletal  (+) Arthritis , Osteoarthritis,    Abdominal   Peds  Hematology  (+) anemia ,   Anesthesia Other Findings   Reproductive/Obstetrics                            Anesthesia Physical Anesthesia Plan  ASA: 2  Anesthesia Plan: General   Post-op Pain Management: Minimal or no pain anticipated   Induction: Intravenous  PONV Risk Score and Plan: TIVA and Ondansetron  Airway Management Planned: Natural Airway, Nasal Cannula and Simple Face Mask  Additional Equipment:   Intra-op Plan:   Post-operative Plan:   Informed Consent: I have reviewed the patients History and Physical, chart, labs and discussed the procedure including the risks, benefits and alternatives for the proposed anesthesia with the patient or authorized representative who has indicated his/her understanding and acceptance.     Dental advisory given  Plan Discussed with: CRNA and Surgeon  Anesthesia Plan Comments:         Anesthesia Quick Evaluation

## 2021-04-11 NOTE — Interval H&P Note (Signed)
History and Physical Interval Note:  04/11/2021 7:35 AM  Katherine Pope  has presented today for surgery, with the diagnosis of Over active Bladder.  The various methods of treatment have been discussed with the patient and family. After consideration of risks, benefits and other options for treatment, the patient has consented to  Procedure(s): CYSTOSCOPY (N/A) BOTOX INJECTION (N/A) as a surgical intervention.  The patient's history has been reviewed, patient examined, no change in status, stable for surgery.  I have reviewed the patient's chart and labs.  Questions were answered to the patient's satisfaction.     Nicolette Bang

## 2021-04-11 NOTE — Anesthesia Postprocedure Evaluation (Signed)
Anesthesia Post Note  Patient: Katherine Pope  Procedure(s) Performed: CYSTOSCOPY (Bladder) BOTOX INJECTION (Bladder)  Patient location during evaluation: Phase II Anesthesia Type: General Level of consciousness: awake and alert and oriented Pain management: pain level controlled Vital Signs Assessment: post-procedure vital signs reviewed and stable Respiratory status: spontaneous breathing, nonlabored ventilation and respiratory function stable Cardiovascular status: blood pressure returned to baseline and stable Postop Assessment: no apparent nausea or vomiting Anesthetic complications: no   No notable events documented.   Last Vitals:  Vitals:   04/11/21 0900 04/11/21 0909  BP: (!) 125/56 (!) 151/72  Pulse: 61 67  Resp: 12 16  Temp:  36.5 C  SpO2: 94% 94%    Last Pain:  Vitals:   04/11/21 0909  TempSrc: Oral  PainSc: 0-No pain                 Teruo Stilley C Neomi Laidler

## 2021-04-11 NOTE — Op Note (Signed)
Preoperative diagnosis: overactive bladder  Postoperative diagnosis: same  Procedure: 1 cystoscopy 2. Intravesical botox injection 100 units  Attending: Nicolette Bang  Anesthesia: General  Estimated blood loss: Minimal  Drains: none  Specimens: none  Antibiotics: ancef  Findings:  Ureteral orifices in normal anatomic location.  No masses/lesions in the bladder.   Indications: Patient is a 70 year old female with a history of overactive bladder and urinary leakage refractory to anticholinergic therapy.  After discussing treatment options, they decided proceed with intravesical botox injection.  Procedure in detail: The patient was brought to the operating room and a brief timeout was done to ensure correct patient, correct procedure, correct site.  General anesthesia was administered patient was placed in dorsal lithotomy position.  Their genitalia was then prepped and draped in usual sterile fashion.  A rigid 61 French cystoscope was passed in the urethra and the bladder.  Bladder was inspected and we noted no masses or lesions.  the ureteral orifices were in the normal orthotopic locations. Using a deflux injection needle we proceed to inject 100 units in a grid pattern between the ureteral orifices starting at the trigone to the mid posterior wall. We injected a total of 20 sites with 5 units per injection. The bladder was then drained and this concluded the procedure which was well tolerated by patient.  Complications: None  Condition: Stable, extubated, transferred to PACU  Plan: Patient is to be discharge home. They will followup in 1 month

## 2021-04-11 NOTE — Transfer of Care (Signed)
Immediate Anesthesia Transfer of Care Note  Patient: Katherine Pope  Procedure(s) Performed: CYSTOSCOPY (Bladder) BOTOX INJECTION (Bladder)  Patient Location: PACU  Anesthesia Type:General  Level of Consciousness: awake, alert  and oriented  Airway & Oxygen Therapy: Patient Spontanous Breathing and Patient connected to nasal cannula oxygen  Post-op Assessment: Report given to RN and Post -op Vital signs reviewed and stable  Post vital signs: Reviewed and stable  Last Vitals:  Vitals Value Taken Time  BP 153/79   Temp    Pulse 85   Resp 14   SpO2 91%     Last Pain:  Vitals:   04/11/21 0656  PainSc: 0-No pain         Complications: No notable events documented.

## 2021-04-11 NOTE — Anesthesia Procedure Notes (Signed)
Date/Time: 04/11/2021 7:55 AM Performed by: Karna Dupes, CRNA Pre-anesthesia Checklist: Patient identified, Emergency Drugs available, Suction available and Patient being monitored Patient Re-evaluated:Patient Re-evaluated prior to induction Oxygen Delivery Method: Circle system utilized and Non-rebreather mask Preoxygenation: Pre-oxygenation with 100% oxygen Induction Type: IV induction

## 2021-04-12 ENCOUNTER — Encounter (HOSPITAL_COMMUNITY): Payer: Self-pay | Admitting: Urology

## 2021-04-27 ENCOUNTER — Encounter: Payer: Self-pay | Admitting: Urology

## 2021-04-27 ENCOUNTER — Ambulatory Visit (INDEPENDENT_AMBULATORY_CARE_PROVIDER_SITE_OTHER): Payer: Medicare HMO | Admitting: Urology

## 2021-04-27 ENCOUNTER — Other Ambulatory Visit: Payer: Self-pay

## 2021-04-27 VITALS — BP 132/78 | HR 69

## 2021-04-27 DIAGNOSIS — N3281 Overactive bladder: Secondary | ICD-10-CM | POA: Diagnosis not present

## 2021-04-27 LAB — URINALYSIS, ROUTINE W REFLEX MICROSCOPIC
Bilirubin, UA: NEGATIVE
Glucose, UA: NEGATIVE
Ketones, UA: NEGATIVE
Leukocytes,UA: NEGATIVE
Nitrite, UA: NEGATIVE
Protein,UA: NEGATIVE
RBC, UA: NEGATIVE
Specific Gravity, UA: 1.015 (ref 1.005–1.030)
Urobilinogen, Ur: 0.2 mg/dL (ref 0.2–1.0)
pH, UA: 5.5 (ref 5.0–7.5)

## 2021-04-27 LAB — BLADDER SCAN AMB NON-IMAGING: Scan Result: 17

## 2021-04-27 MED ORDER — GEMTESA 75 MG PO TABS: 1.0000 | ORAL_TABLET | Freq: Every day | ORAL | 0 refills | Status: AC

## 2021-04-27 NOTE — Patient Instructions (Signed)

## 2021-04-27 NOTE — Progress Notes (Signed)
post void residual=17 Urological Symptom Review  Patient is experiencing the following symptoms:Frequent urination Hard to postpone urination Leakage of urine Injury to kidneys/bladder   Review of Systems  Gastrointestinal (upper)  : Negative for upper GI symptoms  Gastrointestinal (lower) : Negative for lower GI symptoms  Constitutional : Negative for symptoms  Skin: Negative for skin symptoms  Eyes: Negative for eye symptoms  Ear/Nose/Throat : Negative for Ear/Nose/Throat symptoms  Hematologic/Lymphatic: Negative for Hematologic/Lymphatic symptoms  Cardiovascular : Negative for cardiovascular symptoms  Respiratory : Negative for respiratory symptoms  Endocrine: Negative for endocrine symptoms  Musculoskeletal: Back pain Joint pain  Neurological: Negative for neurological symptoms  Psychologic: Depression Anxiety

## 2021-04-27 NOTE — Progress Notes (Signed)
04/27/2021 11:46 AM   [name] [name] Dec 14, 1950 [id number]  Referring provider: Medicine, [facility] Internal [place],  [place] [id number]  Followup OAB   HPI: Ms Barkan is a 71yo here for followup for OAB. She underwent intravesical botox 12/19 and notes improvement in her urinary control and less incontinence. She uses timed voiding which improves her incontinence episodes. Her urine flow is slower since intravesical botox   PMH: Past Medical History:  Diagnosis Date   Anxiety    Arthritis    Cancer ([place])    Cervical - age 20   Chronic back pain    COVID-19 2020   Depression    GERD (gastroesophageal reflux disease)    Hypercholesteremia    Hypothyroidism    PONV (postoperative nausea and vomiting)    after pelvis surgery - 7 hour surgery    Surgical History: Past Surgical History:  Procedure Laterality Date   ABDOMINAL HYSTERECTOMY     BOTOX INJECTION N/A 04/11/2021   Procedure: BOTOX INJECTION;  Surgeon: Cleon Gustin, MD;  [facility];  Service: Urology;  Laterality: N/A;   CHOLECYSTECTOMY     CYSTOSCOPY N/A 04/11/2021   Procedure: CYSTOSCOPY;  Surgeon: Cleon Gustin, MD;  [facility];  Service: Urology;  Laterality: N/A;   ERCP N/A 12/16/2018   Procedure: ENDOSCOPIC RETROGRADE CHOLANGIOPANCREATOGRAPHY (ERCP);  Surgeon: Rogene Houston, MD;  [facility];  Service: Endoscopy;  Laterality: N/A;   EXTERNAL FIXATION PELVIS  12/03/2019    EXTERNAL FIXATION PELVIS (N/A )   EXTERNAL FIXATION PELVIS N/A 12/03/2019   Procedure: EXTERNAL FIXATION PELVIS;  Surgeon: Shona Needles, MD;  Location: [place];  Service: Orthopedics;  Laterality: N/A;   HARDWARE REMOVAL Left 11/10/2020   Procedure: HARDWARE REMOVAL FROM PELVIS;  Surgeon: Shona Needles, MD;  Location: [place];  Service: Orthopedics;  Laterality: Left;   LAPAROSCOPIC CHOLECYSTECTOMY  05/2016   LITHOTRIPSY  12/16/2018   Procedure: LITHOTRIPSY WITH BASKET;  Surgeon: Rogene Houston, MD;   [facility];  Service: Endoscopy;;   REMOVAL OF STONES N/A 12/16/2018   Procedure: REMOVAL OF STONES WITH BASKET AND STONE BALLOON EXTRACTOR;  Surgeon: Rogene Houston, MD;  [facility];  Service: Endoscopy;  Laterality: N/A;   SPHINCTEROTOMY N/A 12/16/2018   Procedure: SPHINCTEROTOMY;  Surgeon: Rogene Houston, MD;  [facility];  Service: Endoscopy;  Laterality: N/A;    Home Medications:  Allergies as of 04/27/2021       Reactions   Morphine And Related Shortness Of Breath   Oxygen level dropped.         Medication List        Accurate as of April 27, 2021 11:46 AM. If you have any questions, ask your nurse or doctor.          ARTHRITIS PAIN RELIEF RUB EX Apply 1 application topically daily as needed (pain).   BC Fast Pain Relief Arthritis 1000-65 MG Pack Generic drug: Aspirin-Caffeine Take 1 packet by mouth daily as needed (pain).   citalopram 20 MG tablet Commonly known as: CELEXA Take 20 mg by mouth daily.   fluticasone 50 MCG/ACT nasal spray Commonly known as: FLONASE Place 1 spray into both nostrils daily as needed for allergies or rhinitis.   gabapentin 300 MG capsule Commonly known as: NEURONTIN Take 300 mg by mouth daily.   HYDROcodone-acetaminophen 5-325 MG tablet Commonly known as: NORCO/VICODIN Take 1 tablet by mouth every 6 (  six) hours as needed for severe pain.   levothyroxine 88 MCG tablet Commonly known as: SYNTHROID Take 88 mcg by mouth daily before breakfast.   LORazepam 0.5 MG tablet Commonly known as: ATIVAN Take 1-2 tablets (0.5-1 mg total) by mouth every 6 (six) hours as needed for anxiety or sleep. What changed:  how much to take when to take this   meloxicam 15 MG tablet Commonly known as: MOBIC Take 15 mg by mouth daily.   pantoprazole 40 MG tablet Commonly known as: PROTONIX Take 40 mg by mouth daily.        Allergies:  Allergies  Allergen Reactions   Morphine And Related Shortness  Of Breath    Oxygen level dropped.     Family History: Family History  Problem Relation Age of Onset   COPD Mother    Lung cancer Father    Colon cancer Neg Hx    Colon polyps Neg Hx     Social History:  reports that she quit smoking about 25 years ago. Her smoking use included cigarettes. She has a 20.00 pack-year smoking history. She has never used smokeless tobacco. She reports that she does not drink alcohol and does not use drugs.  ROS: All other review of systems were reviewed and are negative except what is noted above in HPI  Physical Exam: BP 132/78    Pulse 69   Constitutional:  Alert and oriented, No acute distress. HEENT: [place] AT, moist mucus membranes.  Trachea midline, no masses. Cardiovascular: No clubbing, cyanosis, or edema. Respiratory: Normal respiratory effort, no increased work of breathing. GI: Abdomen is soft, nontender, nondistended, no abdominal masses GU: No CVA tenderness.  Lymph: No cervical or inguinal lymphadenopathy. Skin: No rashes, bruises or suspicious lesions. Neurologic: Grossly intact, no focal deficits, moving all 4 extremities. Psychiatric: Normal mood and affect.  Laboratory Data: Lab Results  Component Value Date   WBC 6.8 11/10/2020   HGB 13.3 11/10/2020   HCT 42.5 11/10/2020   MCV 94.7 11/10/2020   PLT 212 11/10/2020    Lab Results  Component Value Date   CREATININE 0.68 12/10/2019    No results found for: PSA  No results found for: TESTOSTERONE  No results found for: HGBA1C  Urinalysis    Component Value Date/Time   COLORURINE YELLOW 12/03/2019 1649   APPEARANCEUR Clear 03/16/2021 1437   LABSPEC >1.030 (H) 12/03/2019 1649   PHURINE 5.0 12/03/2019 1649   GLUCOSEU Negative 03/16/2021 1437   HGBUR LARGE (A) 12/03/2019 1649   BILIRUBINUR Negative 03/16/2021 [place] 12/03/2019 1649   PROTEINUR Negative 03/16/2021 1437   PROTEINUR NEGATIVE 12/03/2019 1649   NITRITE Negative 03/16/2021 1437    NITRITE NEGATIVE 12/03/2019 1649   LEUKOCYTESUR Negative 03/16/2021 1437   LEUKOCYTESUR NEGATIVE 12/03/2019 1649    Lab Results  Component Value Date   LABMICR Comment 03/16/2021   WBCUA None seen 07/23/2020   LABEPIT 0-10 07/23/2020   BACTERIA Few 07/23/2020    Pertinent Imaging:  No results found for this or any previous visit.  No results found for this or any previous visit.  No results found for this or any previous visit.  No results found for this or any previous visit.  No results found for this or any previous visit.  No results found for this or any previous visit.  No results found for this or any previous visit.  No results found for this or any previous visit.   Assessment & Plan:  1. Overactive bladder -We will add gemtesa 75mg  daily - BLADDER SCAN AMB NON-IMAGING - Urinalysis, Routine w reflex microscopic   No follow-ups on file.  Nicolette Bang, MD  [facility] [place]

## 2021-06-06 ENCOUNTER — Ambulatory Visit: Payer: Medicare HMO | Admitting: Urology

## 2021-06-24 ENCOUNTER — Other Ambulatory Visit: Payer: Self-pay

## 2021-06-24 ENCOUNTER — Ambulatory Visit: Payer: Medicare HMO | Admitting: Urology

## 2021-06-24 ENCOUNTER — Encounter: Payer: Self-pay | Admitting: Urology

## 2021-06-24 VITALS — BP 144/74 | HR 77

## 2021-06-24 DIAGNOSIS — R32 Unspecified urinary incontinence: Secondary | ICD-10-CM | POA: Diagnosis not present

## 2021-06-24 DIAGNOSIS — N3281 Overactive bladder: Secondary | ICD-10-CM

## 2021-06-24 LAB — URINALYSIS, ROUTINE W REFLEX MICROSCOPIC
Bilirubin, UA: NEGATIVE
Glucose, UA: NEGATIVE
Ketones, UA: NEGATIVE
Leukocytes,UA: NEGATIVE
Nitrite, UA: NEGATIVE
Protein,UA: NEGATIVE
RBC, UA: NEGATIVE
Specific Gravity, UA: 1.025 (ref 1.005–1.030)
Urobilinogen, Ur: 1 mg/dL (ref 0.2–1.0)
pH, UA: 5.5 (ref 5.0–7.5)

## 2021-06-24 NOTE — Progress Notes (Signed)
? ?06/24/2021 ?12:44 PM  ? ?Katherine Pope ?[birth year] ?[id number] ? ?Referring provider: Medicine, [facility] Internal ?[place] ?[place],   [id number] ? ?Urinary incontinence ? ? ?HPI: ?Katherine Pope is a 71yo here for followup for urinary incontinence. She notes since last visit her incontinence has worsened. She underwent 100 units intravesical botox 10 weeks ago. Initially the botox decreased the incontinence for several weeks but then the incontinence worsened. She has unaware urinary incontinence. She has failed mirabegron, gemtesa, toviaz. She failed PTNS No other complaints today ? ? ?PMH: ?Past Medical History:  ?Diagnosis Date  ? Anxiety   ? Arthritis   ? Cancer [facility])   ? Cervical - age 46  ? Chronic back pain   ? COVID-19 2020  ? Depression   ? GERD (gastroesophageal reflux disease)   ? Hypercholesteremia   ? Hypothyroidism   ? PONV (postoperative nausea and vomiting)   ? after pelvis surgery - 7 hour surgery  ? ? ?Surgical History: ?Past Surgical History:  ?Procedure Laterality Date  ? ABDOMINAL HYSTERECTOMY    ? BOTOX INJECTION N/A 04/11/2021  ? Procedure: BOTOX INJECTION;  Surgeon: Cleon Gustin, MD;  [facility];  Service: Urology;  Laterality: N/A;  ? CHOLECYSTECTOMY    ? CYSTOSCOPY N/A 04/11/2021  ? Procedure: CYSTOSCOPY;  Surgeon: Cleon Gustin, MD;  [facility];  Service: Urology;  Laterality: N/A;  ? ERCP N/A 12/16/2018  ? Procedure: ENDOSCOPIC RETROGRADE CHOLANGIOPANCREATOGRAPHY (ERCP);  Surgeon: Rogene Houston, MD;  [facility];  Service: Endoscopy;  Laterality: N/A;  ? EXTERNAL FIXATION PELVIS  12/03/2019  ?  EXTERNAL FIXATION PELVIS (N/A )  ? EXTERNAL FIXATION PELVIS N/A 12/03/2019  ? Procedure: EXTERNAL FIXATION PELVIS;  Surgeon: Shona Needles, MD;  Location: [place];  Service: Orthopedics;  Laterality: N/A;  ? HARDWARE REMOVAL Left 11/10/2020  ? Procedure: HARDWARE REMOVAL FROM PELVIS;  Surgeon: Shona Needles, MD;  Location: [place];  Service: Orthopedics;  Laterality:  Left;  ? LAPAROSCOPIC CHOLECYSTECTOMY  05/2016  ? LITHOTRIPSY  12/16/2018  ? Procedure: LITHOTRIPSY WITH BASKET;  Surgeon: Rogene Houston, MD;  [facility];  Service: Endoscopy;;  ? REMOVAL OF STONES N/A 12/16/2018  ? Procedure: REMOVAL OF STONES WITH BASKET AND STONE BALLOON EXTRACTOR;  Surgeon: Rogene Houston, MD;  [facility];  Service: Endoscopy;  Laterality: N/A;  ? SPHINCTEROTOMY N/A 12/16/2018  ? Procedure: SPHINCTEROTOMY;  Surgeon: Rogene Houston, MD;  [facility];  Service: Endoscopy;  Laterality: N/A;  ? ? ?Home Medications:  ?Allergies as of 06/24/2021   ? ?   Reactions  ? Morphine And Related Shortness Of Breath  ? Oxygen level dropped.   ? ?  ? ?  ?Medication List  ?  ? ?  ? Accurate as of June 24, 2021 12:44 PM. If you have any questions, ask your nurse or doctor.  ?  ?  ? ?  ? ?ARTHRITIS PAIN RELIEF RUB EX ?Apply 1 application topically daily as needed (pain). ?  ?BC Fast Pain Relief Arthritis 1000-65 MG Pack ?Generic drug: Aspirin-Caffeine ?Take 1 packet by mouth daily as needed (pain). ?  ?citalopram 20 MG tablet ?Commonly known as: CELEXA ?Take 20 mg by mouth daily. ?  ?fluticasone 50 MCG/ACT nasal spray ?Commonly known as: FLONASE ?Place 1 spray into both nostrils daily as needed for allergies or rhinitis. ?  ?gabapentin 300 MG capsule ?Commonly known as: NEURONTIN ?Take 300 mg by  mouth daily. ?  ?Gemtesa 75 MG Tabs ?Generic drug: Vibegron ?Take 1 capsule by mouth daily. ?  ?HYDROcodone-acetaminophen 5-325 MG tablet ?Commonly known as: NORCO/VICODIN ?Take 1 tablet by mouth every 6 (six) hours as needed for severe pain. ?  ?levothyroxine 88 MCG tablet ?Commonly known as: SYNTHROID ?Take 88 mcg by mouth daily before breakfast. ?  ?LORazepam 0.5 MG tablet ?Commonly known as: ATIVAN ?Take 1-2 tablets (0.5-1 mg total) by mouth every 6 (six) hours as needed for anxiety or sleep. ?What changed:  ?how much to take ?when to take this ?  ?meloxicam 15 MG tablet ?Commonly  known as: MOBIC ?Take 15 mg by mouth daily. ?  ?pantoprazole 40 MG tablet ?Commonly known as: PROTONIX ?Take 40 mg by mouth daily. ?  ? ?  ? ? ?Allergies:  ?Allergies  ?Allergen Reactions  ? Morphine And Related Shortness Of Breath  ?  Oxygen level dropped.   ? ? ?Family History: ?Family History  ?Problem Relation Age of Onset  ? COPD Mother   ? Lung cancer Father   ? Colon cancer Neg Hx   ? Colon polyps Neg Hx   ? ? ?Social History:  reports that she quit smoking about 25 years ago. Her smoking use included cigarettes. She has a 20.00 pack-year smoking history. She has never used smokeless tobacco. She reports that she does not drink alcohol and does not use drugs. ? ?ROS: ?All other review of systems were reviewed and are negative except what is noted above in HPI ? ?Physical Exam: ?BP (!) 144/74   Pulse 77   ?Constitutional:  Alert and oriented, No acute distress. ?HEENT: [place] AT, moist mucus membranes.  Trachea midline, no masses. ?Cardiovascular: No clubbing, cyanosis, or edema. ?Respiratory: Normal respiratory effort, no increased work of breathing. ?GI: Abdomen is soft, nontender, nondistended, no abdominal masses ?GU: No CVA tenderness.  ?Lymph: No cervical or inguinal lymphadenopathy. ?Skin: No rashes, bruises or suspicious lesions. ?Neurologic: Grossly intact, no focal deficits, moving all 4 extremities. ?Psychiatric: Normal mood and affect. ? ?Laboratory Data: ?Lab Results  ?Component Value Date  ? WBC 6.8 11/10/2020  ? HGB 13.3 11/10/2020  ? HCT 42.5 11/10/2020  ? MCV 94.7 11/10/2020  ? PLT 212 11/10/2020  ? ? ?Lab Results  ?Component Value Date  ? CREATININE 0.68 12/10/2019  ? ? ?No results found for: PSA ? ?No results found for: TESTOSTERONE ? ?No results found for: HGBA1C ? ?Urinalysis ?   ?Component Value Date/Time  ? [place] YELLOW 12/03/2019 1649  ? APPEARANCEUR Clear 04/27/2021 1149  ? LABSPEC >1.030 (H) 12/03/2019 1649  ? PHURINE 5.0 12/03/2019 1649  ? GLUCOSEU Negative 04/27/2021 1149  ? HGBUR  LARGE (A) 12/03/2019 1649  ? BILIRUBINUR Negative 04/27/2021 1149  ? [place] NEGATIVE 12/03/2019 1649  ? PROTEINUR Negative 04/27/2021 1149  ? [place] NEGATIVE 12/03/2019 1649  ? NITRITE Negative 04/27/2021 1149  ? NITRITE NEGATIVE 12/03/2019 1649  ? LEUKOCYTESUR Negative 04/27/2021 1149  ? LEUKOCYTESUR NEGATIVE 12/03/2019 1649  ? ? ?Lab Results  ?Component Value Date  ? LABMICR Comment 04/27/2021  ? [place] None seen 07/23/2020  ? LABEPIT 0-10 07/23/2020  ? BACTERIA Few 07/23/2020  ? ? ?Pertinent Imaging: ? ?No results found for this or any previous visit. ? ?No results found for this or any previous visit. ? ?No results found for this or any previous visit. ? ?No results found for this or any previous visit. ? ?No results found for this or any previous visit. ? ?No  results found for this or any previous visit. ? ?No results found for this or any previous visit. ? ?No results found for this or any previous visit. ? ? ?Assessment & Plan:   ? ?1. Overactive bladder ?-Schedule for UDS ?- Urinalysis, Routine w reflex microscopic ? ?2. Urinary incontinence, unspecified type ?-Schedule for UDS ?- Urinalysis, Routine w reflex microscopic ? ? ?No follow-ups on file. ? ?Nicolette Bang, MD ? ?[facility] [place] ?  ?

## 2021-07-07 DIAGNOSIS — N3946 Mixed incontinence: Secondary | ICD-10-CM | POA: Diagnosis not present

## 2021-07-12 DIAGNOSIS — H524 Presbyopia: Secondary | ICD-10-CM | POA: Diagnosis not present

## 2021-07-12 DIAGNOSIS — Z961 Presence of intraocular lens: Secondary | ICD-10-CM | POA: Diagnosis not present

## 2021-07-26 ENCOUNTER — Ambulatory Visit: Payer: Medicare HMO | Admitting: Urology

## 2021-07-26 DIAGNOSIS — Z1231 Encounter for screening mammogram for malignant neoplasm of breast: Secondary | ICD-10-CM | POA: Diagnosis not present

## 2021-07-27 ENCOUNTER — Encounter: Payer: Self-pay | Admitting: Urology

## 2021-07-27 ENCOUNTER — Ambulatory Visit (INDEPENDENT_AMBULATORY_CARE_PROVIDER_SITE_OTHER): Payer: Medicare HMO | Admitting: Urology

## 2021-07-27 VITALS — BP 145/51 | HR 73 | Ht 65.5 in | Wt 197.0 lb

## 2021-07-27 DIAGNOSIS — R32 Unspecified urinary incontinence: Secondary | ICD-10-CM | POA: Diagnosis not present

## 2021-07-27 LAB — URINALYSIS, ROUTINE W REFLEX MICROSCOPIC
Bilirubin, UA: NEGATIVE
Glucose, UA: NEGATIVE
Ketones, UA: NEGATIVE
Leukocytes,UA: NEGATIVE
Nitrite, UA: NEGATIVE
Protein,UA: NEGATIVE
RBC, UA: NEGATIVE
Specific Gravity, UA: 1.02 (ref 1.005–1.030)
Urobilinogen, Ur: 0.2 mg/dL (ref 0.2–1.0)
pH, UA: 5.5 (ref 5.0–7.5)

## 2021-07-27 NOTE — Progress Notes (Signed)
? ?07/27/2021 ?10:13 AM  ? ?Katherine Pope ?[birth year] ?[id number] ? ?Referring provider: Medicine, [facility] Internal ?[place] ?[place],  [place] [address] ? ?Followup urge incontinence ? ? ?HPI: ?Katherine Pope is a 71yo here for followup for urinary incontinence. She has tried mirabegron, gemtesa, vesicare and detrol without improvement in her urinary urgency, frequency. She tried PTNS which failed to imporve her LUTS. She underwent intravesical botox 03/2021 and had an initial improvement in her urinary urgency, frequency and urge incontinence but she is now back to baseline She underwent UDS which showed a capacity of 350cc, no SUI at 88cm H20. She had an unstable detrusor contraction with changing position in the UDS chair. She had no voluntary contraction during the UDS.  ? ? ?PMH: ?Past Medical History:  ?Diagnosis Date  ? Anxiety   ? Arthritis   ? Cancer [facility])   ? Cervical - age 56  ? Chronic back pain   ? COVID-19 2020  ? Depression   ? GERD (gastroesophageal reflux disease)   ? Hypercholesteremia   ? Hypothyroidism   ? PONV (postoperative nausea and vomiting)   ? after pelvis surgery - 7 hour surgery  ? ? ?Surgical History: ?Past Surgical History:  ?Procedure Laterality Date  ? ABDOMINAL HYSTERECTOMY    ? BOTOX INJECTION N/A 04/11/2021  ? Procedure: BOTOX INJECTION;  Surgeon: Cleon Gustin, MD;  [facility];  Service: Urology;  Laterality: N/A;  ? CHOLECYSTECTOMY    ? CYSTOSCOPY N/A 04/11/2021  ? Procedure: CYSTOSCOPY;  Surgeon: Cleon Gustin, MD;  [facility];  Service: Urology;  Laterality: N/A;  ? ERCP N/A 12/16/2018  ? Procedure: ENDOSCOPIC RETROGRADE CHOLANGIOPANCREATOGRAPHY (ERCP);  Surgeon: Rogene Houston, MD;  [facility];  Service: Endoscopy;  Laterality: N/A;  ? EXTERNAL FIXATION PELVIS  12/03/2019  ?  EXTERNAL FIXATION PELVIS (N/A )  ? EXTERNAL FIXATION PELVIS N/A 12/03/2019  ? Procedure: EXTERNAL FIXATION PELVIS;  Surgeon: Shona Needles, MD;  Location: [place];  Service:  Orthopedics;  Laterality: N/A;  ? HARDWARE REMOVAL Left 11/10/2020  ? Procedure: HARDWARE REMOVAL FROM PELVIS;  Surgeon: Shona Needles, MD;  Location: [place];  Service: Orthopedics;  Laterality: Left;  ? LAPAROSCOPIC CHOLECYSTECTOMY  05/2016  ? LITHOTRIPSY  12/16/2018  ? Procedure: LITHOTRIPSY WITH BASKET;  Surgeon: Rogene Houston, MD;  [facility];  Service: Endoscopy;;  ? REMOVAL OF STONES N/A 12/16/2018  ? Procedure: REMOVAL OF STONES WITH BASKET AND STONE BALLOON EXTRACTOR;  Surgeon: Rogene Houston, MD;  [facility];  Service: Endoscopy;  Laterality: N/A;  ? SPHINCTEROTOMY N/A 12/16/2018  ? Procedure: SPHINCTEROTOMY;  Surgeon: Rogene Houston, MD;  [facility];  Service: Endoscopy;  Laterality: N/A;  ? ? ?Home Medications:  ?Allergies as of 07/27/2021   ? ?   Reactions  ? Morphine And Related Shortness Of Breath  ? Oxygen level dropped.   ? ?  ? ?  ?Medication List  ?  ? ?  ? Accurate as of July 27, 2021 10:13 AM. If you have any questions, ask your nurse or doctor.  ?  ?  ? ?  ? ?ARTHRITIS PAIN RELIEF RUB EX ?Apply 1 application topically daily as needed (pain). ?  ?BC Fast Pain Relief Arthritis 1000-65 MG Pack ?Generic drug: Aspirin-Caffeine ?Take 1 packet by mouth daily as needed (pain). ?  ?citalopram 20 MG tablet ?Commonly known as: CELEXA ?Take 20 mg by mouth daily. ?  ?  fluticasone 50 MCG/ACT nasal spray ?Commonly known as: FLONASE ?Place 1 spray into both nostrils daily as needed for allergies or rhinitis. ?  ?gabapentin 300 MG capsule ?Commonly known as: NEURONTIN ?Take 300 mg by mouth daily. ?  ?Gemtesa 75 MG Tabs ?Generic drug: Vibegron ?Take 1 capsule by mouth daily. ?  ?HYDROcodone-acetaminophen 5-325 MG tablet ?Commonly known as: NORCO/VICODIN ?Take 1 tablet by mouth every 6 (six) hours as needed for severe pain. ?  ?levothyroxine 88 MCG tablet ?Commonly known as: SYNTHROID ?Take 88 mcg by mouth daily before breakfast. ?  ?LORazepam 0.5 MG tablet ?Commonly known  as: ATIVAN ?Take 1-2 tablets (0.5-1 mg total) by mouth every 6 (six) hours as needed for anxiety or sleep. ?  ?meloxicam 15 MG tablet ?Commonly known as: MOBIC ?Take 15 mg by mouth daily. ?  ?pantoprazole 40 MG tablet ?Commonly known as: PROTONIX ?Take 40 mg by mouth daily. ?  ? ?  ? ? ?Allergies:  ?Allergies  ?Allergen Reactions  ? Morphine And Related Shortness Of Breath  ?  Oxygen level dropped.   ? ? ?Family History: ?Family History  ?Problem Relation Age of Onset  ? COPD Mother   ? Lung cancer Father   ? Colon cancer Neg Hx   ? Colon polyps Neg Hx   ? ? ?Social History:  reports that she quit smoking about 25 years ago. Her smoking use included cigarettes. She has a 20.00 pack-year smoking history. She has never used smokeless tobacco. She reports that she does not drink alcohol and does not use drugs. ? ?ROS: ?All other review of systems were reviewed and are negative except what is noted above in HPI ? ?Physical Exam: ?BP (!) 145/51   Pulse 73   Ht 5' 5.5" (1.664 m)   Wt 197 lb (89.4 kg)   BMI 32.28 kg/m?   ?Constitutional:  Alert and oriented, No acute distress. ?HEENT: [place] AT, moist mucus membranes.  Trachea midline, no masses. ?Cardiovascular: No clubbing, cyanosis, or edema. ?Respiratory: Normal respiratory effort, no increased work of breathing. ?GI: Abdomen is soft, nontender, nondistended, no abdominal masses ?GU: No CVA tenderness.  ?Lymph: No cervical or inguinal lymphadenopathy. ?Skin: No rashes, bruises or suspicious lesions. ?Neurologic: Grossly intact, no focal deficits, moving all 4 extremities. ?Psychiatric: Normal mood and affect. ? ?Laboratory Data: ?Lab Results  ?Component Value Date  ? WBC 6.8 11/10/2020  ? HGB 13.3 11/10/2020  ? HCT 42.5 11/10/2020  ? MCV 94.7 11/10/2020  ? PLT 212 11/10/2020  ? ? ?Lab Results  ?Component Value Date  ? CREATININE 0.68 12/10/2019  ? ? ?No results found for: PSA ? ?No results found for: TESTOSTERONE ? ?No results found for: HGBA1C ? ?Urinalysis ?    ?Component Value Date/Time  ? [place] YELLOW 12/03/2019 1649  ? APPEARANCEUR Clear 06/24/2021 1225  ? LABSPEC >1.030 (H) 12/03/2019 1649  ? PHURINE 5.0 12/03/2019 1649  ? GLUCOSEU Negative 06/24/2021 1225  ? HGBUR LARGE (A) 12/03/2019 1649  ? BILIRUBINUR Negative 06/24/2021 1225  ? [place] NEGATIVE 12/03/2019 1649  ? PROTEINUR Negative 06/24/2021 1225  ? [place] NEGATIVE 12/03/2019 1649  ? NITRITE Negative 06/24/2021 1225  ? NITRITE NEGATIVE 12/03/2019 1649  ? LEUKOCYTESUR Negative 06/24/2021 1225  ? LEUKOCYTESUR NEGATIVE 12/03/2019 1649  ? ? ?Lab Results  ?Component Value Date  ? LABMICR Comment 06/24/2021  ? [place] None seen 07/23/2020  ? LABEPIT 0-10 07/23/2020  ? BACTERIA Few 07/23/2020  ? ? ?Pertinent Imaging: ?Urodynamics 07/13/2021: Images reviewed and discussed with the  patient  ?No results found for this or any previous visit. ? ?No results found for this or any previous visit. ? ?No results found for this or any previous visit. ? ?No results found for this or any previous visit. ? ?No results found for this or any previous visit. ? ?No results found for this or any previous visit. ? ?No results found for this or any previous visit. ? ?No results found for this or any previous visit. ? ? ?Assessment & Plan:   ? ?1. Urinary incontinence, unspecified type ?-Referral to Dr. Matilde Sprang at [facility] ?- Urinalysis, Routine w reflex microscopic ? ? ?No follow-ups on file. ? ?Nicolette Bang, MD ? ?[facility] [place] ?  ?

## 2021-07-27 NOTE — Patient Instructions (Signed)
Urinary Incontinence °Urinary incontinence refers to a condition in which a person is unable to control where and when to pass urine. A person with this condition will urinate involuntarily. This means that the person urinates when he or she does not mean to. °What are the causes? °This condition may be caused by: °Medicines. °Infections. °Constipation. °Overactive bladder muscles. °Weak bladder muscles. °Weak pelvic floor muscles. These muscles provide support for the bladder, intestine, and, in women, the uterus. °Enlarged prostate in men. The prostate is a gland near the bladder. When it gets too big, it can pinch the urethra. With the urethra blocked, the bladder can weaken and lose the ability to empty properly. °Surgery. °Emotional factors, such as anxiety, stress, or post-traumatic stress disorder (PTSD). °Spinal cord injury, nerve injury, or other neurological conditions. °Pelvic organ prolapse. This happens in women when organs move out of place and into the vagina. This movement can prevent the bladder and urethra from working properly. °What increases the risk? °The following factors may make you more likely to develop this condition: °Age. The older you are, the higher the risk. °Obesity. °Being physically inactive. °Pregnancy and childbirth. °Menopause. °Diseases that affect the nerves or spinal cord. °Long-term, or chronic, coughing. This can increase pressure on the bladder and pelvic floor muscles. °What are the signs or symptoms? °Symptoms may vary depending on the type of urinary incontinence you have. They include: °A sudden urge to urinate, and passing urine involuntarily before you can get to a bathroom (urge incontinence). °Suddenly passing urine when doing activities that force urine to pass, such as coughing, laughing, exercising, or sneezing (stress incontinence). °Needing to urinate often but urinating only a small amount, or constantly dribbling urine (overflow incontinence). °Urinating  because you cannot get to the bathroom in time due to a physical disability, such as arthritis or injury, or due to a communication or thinking problem, such as Alzheimer's disease (functional incontinence). °How is this diagnosed? °This condition may be diagnosed based on: °Your medical history. °A physical exam. °Tests, such as: °Urine tests. °X-rays of your kidney and bladder. °Ultrasound. °CT scan. °Cystoscopy. In this procedure, a health care provider inserts a tube with a light and camera (cystoscope) through the urethra and into the bladder to check for problems. °Urodynamic testing. These tests assess how well the bladder, urethra, and sphincter can store and release urine. There are different types of urodynamic tests, and they vary depending on what the test is measuring. °To help diagnose your condition, your health care provider may recommend that you keep a log of when you urinate and how much you urinate. °How is this treated? °Treatment for this condition depends on the type of incontinence that you have and its cause. Treatment may include: °Lifestyle changes, such as: °Quitting smoking. °Maintaining a healthy weight. °Staying active. Try to get 150 minutes of moderate-intensity exercise every week. Ask your health care provider which activities are safe for you. °Eating a healthy diet. °Avoid high-fat foods, like fried foods. °Avoid refined carbohydrates like white bread and white rice. °Limit how much alcohol and caffeine you drink. °Increase your fiber intake. Healthy sources of fiber include beans, whole grains, and fresh fruits and vegetables. °Behavioral changes, such as: °Pelvic floor muscle exercises. °Bladder training, such as lengthening the amount of time between bathroom breaks, or using the bathroom at regular intervals. °Using techniques to suppress bladder urges. This can include distraction techniques or controlled breathing exercises. °Medicines, such as: °Medicines to relax the  bladder   muscles and prevent bladder spasms. °Medicines to help slow or prevent the growth of a man's prostate. °Botox injections. These can help relax the bladder muscles. °Treatments, such as: °Using pulses of electricity to help change bladder reflexes (electrical nerve stimulation). °For women, using a medical device to prevent urine leaks. This is a small, tampon-like, disposable device that is inserted into the urethra. °Injecting collagen or carbon beads (bulking agents) into the urinary sphincter. These can help thicken tissue and close the bladder opening. °Surgery. °Follow these instructions at home: °Lifestyle °Limit alcohol and caffeine. These can fill your bladder quickly and irritate it. °Keep yourself clean to help prevent odors and skin damage. Ask your health care provider about special skin creams and cleansers that can protect the skin from urine. °Consider wearing pads or adult diapers. Make sure to change them regularly, and always change them right after experiencing incontinence. °General instructions °Take over-the-counter and prescription medicines only as told by your health care provider. °Use the bathroom about every 3-4 hours, even if you do not feel the need to urinate. Try to empty your bladder completely every time. After urinating, wait a minute. Then try to urinate again. °Make sure you are in a relaxed position while urinating. °If your incontinence is caused by nerve problems, keep a log of the medicines you take and the times you go to the bathroom. °Keep all follow-up visits. This is important. °Where to find more information °[employer] of [employer] and Digestive and Kidney Diseases: www.niddk.nih.gov °[employer]: www.urologyhealth.org °Contact a health care provider if: °You have pain that gets worse. °Your incontinence gets worse. °Get help right away if: °You have a fever or chills. °You are unable to urinate. °You have redness in your groin area or  down your legs. °Summary °Urinary incontinence refers to a condition in which a person is unable to control where and when to pass urine. °This condition may be caused by medicines, infection, weak bladder muscles, weak pelvic floor muscles, enlargement of the prostate (in men), or surgery. °Factors such as older age, obesity, pregnancy and childbirth, menopause, neurological diseases, and chronic coughing may increase your risk for developing this condition. °Types of urinary incontinence include urge incontinence, stress incontinence, overflow incontinence, and functional incontinence. °This condition is usually treated first with lifestyle and behavioral changes, such as quitting smoking, eating a healthier diet, and doing regular pelvic floor exercises. Other treatment options include medicines, bulking agents, medical devices, electrical nerve stimulation, or surgery. °This information is not intended to replace advice given to you by your health care provider. Make sure you discuss any questions you have with your health care provider. °Document Revised: 11/14/2019 Document Reviewed: 11/14/2019 °[employer]vier Patient Education © 2022 [employer] ° °

## 2021-08-03 DIAGNOSIS — N6325 Unspecified lump in the left breast, overlapping quadrants: Secondary | ICD-10-CM | POA: Diagnosis not present

## 2021-08-03 DIAGNOSIS — R928 Other abnormal and inconclusive findings on diagnostic imaging of breast: Secondary | ICD-10-CM | POA: Diagnosis not present

## 2021-08-04 DIAGNOSIS — Z299 Encounter for prophylactic measures, unspecified: Secondary | ICD-10-CM | POA: Diagnosis not present

## 2021-08-04 DIAGNOSIS — N1831 Chronic kidney disease, stage 3a: Secondary | ICD-10-CM | POA: Diagnosis not present

## 2021-08-04 DIAGNOSIS — S39012A Strain of muscle, fascia and tendon of lower back, initial encounter: Secondary | ICD-10-CM | POA: Diagnosis not present

## 2021-08-04 DIAGNOSIS — I700 Atherosclerosis of aorta: Secondary | ICD-10-CM | POA: Diagnosis not present

## 2021-08-04 DIAGNOSIS — F331 Major depressive disorder, recurrent, moderate: Secondary | ICD-10-CM | POA: Diagnosis not present

## 2021-08-04 DIAGNOSIS — F419 Anxiety disorder, unspecified: Secondary | ICD-10-CM | POA: Diagnosis not present

## 2021-08-04 DIAGNOSIS — Z789 Other specified health status: Secondary | ICD-10-CM | POA: Diagnosis not present

## 2021-08-10 DIAGNOSIS — N3946 Mixed incontinence: Secondary | ICD-10-CM | POA: Diagnosis not present

## 2021-09-20 DIAGNOSIS — N3946 Mixed incontinence: Secondary | ICD-10-CM | POA: Diagnosis not present

## 2021-09-22 DIAGNOSIS — Z1339 Encounter for screening examination for other mental health and behavioral disorders: Secondary | ICD-10-CM | POA: Diagnosis not present

## 2021-09-22 DIAGNOSIS — Z1331 Encounter for screening for depression: Secondary | ICD-10-CM | POA: Diagnosis not present

## 2021-09-22 DIAGNOSIS — R5383 Other fatigue: Secondary | ICD-10-CM | POA: Diagnosis not present

## 2021-09-22 DIAGNOSIS — Z1211 Encounter for screening for malignant neoplasm of colon: Secondary | ICD-10-CM | POA: Diagnosis not present

## 2021-09-22 DIAGNOSIS — E7800 Pure hypercholesterolemia, unspecified: Secondary | ICD-10-CM | POA: Diagnosis not present

## 2021-09-22 DIAGNOSIS — Z7189 Other specified counseling: Secondary | ICD-10-CM | POA: Diagnosis not present

## 2021-09-22 DIAGNOSIS — Z0000 Encounter for general adult medical examination without abnormal findings: Secondary | ICD-10-CM | POA: Diagnosis not present

## 2021-09-22 DIAGNOSIS — Z299 Encounter for prophylactic measures, unspecified: Secondary | ICD-10-CM | POA: Diagnosis not present

## 2021-09-22 DIAGNOSIS — Z79899 Other long term (current) drug therapy: Secondary | ICD-10-CM | POA: Diagnosis not present

## 2021-09-22 DIAGNOSIS — E039 Hypothyroidism, unspecified: Secondary | ICD-10-CM | POA: Diagnosis not present

## 2021-09-22 DIAGNOSIS — E6609 Other obesity due to excess calories: Secondary | ICD-10-CM | POA: Diagnosis not present

## 2021-09-22 DIAGNOSIS — Z6831 Body mass index (BMI) 31.0-31.9, adult: Secondary | ICD-10-CM | POA: Diagnosis not present

## 2021-10-04 DIAGNOSIS — N3941 Urge incontinence: Secondary | ICD-10-CM | POA: Diagnosis not present

## 2021-10-13 DIAGNOSIS — R350 Frequency of micturition: Secondary | ICD-10-CM | POA: Diagnosis not present

## 2021-10-13 DIAGNOSIS — N3946 Mixed incontinence: Secondary | ICD-10-CM | POA: Diagnosis not present

## 2021-11-04 DIAGNOSIS — N3941 Urge incontinence: Secondary | ICD-10-CM | POA: Diagnosis not present

## 2021-11-16 DIAGNOSIS — R350 Frequency of micturition: Secondary | ICD-10-CM | POA: Diagnosis not present

## 2021-11-16 DIAGNOSIS — N3946 Mixed incontinence: Secondary | ICD-10-CM | POA: Diagnosis not present

## 2021-12-06 DIAGNOSIS — N3946 Mixed incontinence: Secondary | ICD-10-CM | POA: Diagnosis not present

## 2021-12-06 DIAGNOSIS — R350 Frequency of micturition: Secondary | ICD-10-CM | POA: Diagnosis not present

## 2022-01-03 DIAGNOSIS — N3946 Mixed incontinence: Secondary | ICD-10-CM | POA: Diagnosis not present

## 2022-04-05 DIAGNOSIS — N3946 Mixed incontinence: Secondary | ICD-10-CM | POA: Diagnosis not present

## 2022-04-25 DIAGNOSIS — E039 Hypothyroidism, unspecified: Secondary | ICD-10-CM | POA: Diagnosis not present

## 2022-04-25 DIAGNOSIS — N1831 Chronic kidney disease, stage 3a: Secondary | ICD-10-CM | POA: Diagnosis not present

## 2022-04-25 DIAGNOSIS — Z789 Other specified health status: Secondary | ICD-10-CM | POA: Diagnosis not present

## 2022-04-25 DIAGNOSIS — I700 Atherosclerosis of aorta: Secondary | ICD-10-CM | POA: Diagnosis not present

## 2022-04-25 DIAGNOSIS — Z299 Encounter for prophylactic measures, unspecified: Secondary | ICD-10-CM | POA: Diagnosis not present

## 2022-04-25 DIAGNOSIS — F419 Anxiety disorder, unspecified: Secondary | ICD-10-CM | POA: Diagnosis not present
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
## 2022-08-22 DIAGNOSIS — I700 Atherosclerosis of aorta: Secondary | ICD-10-CM | POA: Diagnosis not present

## 2022-08-22 DIAGNOSIS — Z1331 Encounter for screening for depression: Secondary | ICD-10-CM | POA: Diagnosis not present

## 2022-08-22 DIAGNOSIS — E6609 Other obesity due to excess calories: Secondary | ICD-10-CM | POA: Diagnosis not present

## 2022-08-22 DIAGNOSIS — Z0000 Encounter for general adult medical examination without abnormal findings: Secondary | ICD-10-CM | POA: Diagnosis not present

## 2022-08-22 DIAGNOSIS — D692 Other nonthrombocytopenic purpura: Secondary | ICD-10-CM | POA: Diagnosis not present

## 2022-08-22 DIAGNOSIS — Z299 Encounter for prophylactic measures, unspecified: Secondary | ICD-10-CM | POA: Diagnosis not present

## 2022-08-22 DIAGNOSIS — Z7189 Other specified counseling: Secondary | ICD-10-CM | POA: Diagnosis not present

## 2022-08-22 DIAGNOSIS — Z6832 Body mass index (BMI) 32.0-32.9, adult: Secondary | ICD-10-CM | POA: Diagnosis not present

## 2022-08-22 DIAGNOSIS — Z87891 Personal history of nicotine dependence: Secondary | ICD-10-CM | POA: Diagnosis not present

## 2022-08-22 DIAGNOSIS — Z1339 Encounter for screening examination for other mental health and behavioral disorders: Secondary | ICD-10-CM | POA: Diagnosis not present

## 2022-09-26 DIAGNOSIS — Z0000 Encounter for general adult medical examination without abnormal findings: Secondary | ICD-10-CM | POA: Diagnosis not present

## 2022-09-26 DIAGNOSIS — E7800 Pure hypercholesterolemia, unspecified: Secondary | ICD-10-CM | POA: Diagnosis not present

## 2022-09-26 DIAGNOSIS — R5383 Other fatigue: Secondary | ICD-10-CM | POA: Diagnosis not present

## 2022-09-26 DIAGNOSIS — Z79899 Other long term (current) drug therapy: Secondary | ICD-10-CM | POA: Diagnosis not present

## 2022-09-26 DIAGNOSIS — Z299 Encounter for prophylactic measures, unspecified: Secondary | ICD-10-CM | POA: Diagnosis not present

## 2022-09-26 DIAGNOSIS — Z87891 Personal history of nicotine dependence: Secondary | ICD-10-CM | POA: Diagnosis not present

## 2022-12-27 DIAGNOSIS — I700 Atherosclerosis of aorta: Secondary | ICD-10-CM | POA: Diagnosis not present

## 2022-12-27 DIAGNOSIS — R0789 Other chest pain: Secondary | ICD-10-CM | POA: Diagnosis not present

## 2022-12-27 DIAGNOSIS — Z299 Encounter for prophylactic measures, unspecified: Secondary | ICD-10-CM | POA: Diagnosis not present

## 2022-12-27 DIAGNOSIS — M546 Pain in thoracic spine: Secondary | ICD-10-CM | POA: Diagnosis not present

## 2023-03-20 DIAGNOSIS — I700 Atherosclerosis of aorta: Secondary | ICD-10-CM | POA: Diagnosis not present

## 2023-03-20 DIAGNOSIS — Z299 Encounter for prophylactic measures, unspecified: Secondary | ICD-10-CM | POA: Diagnosis not present

## 2023-03-20 DIAGNOSIS — E7800 Pure hypercholesterolemia, unspecified: Secondary | ICD-10-CM | POA: Diagnosis not present

## 2023-04-04 DIAGNOSIS — R350 Frequency of micturition: Secondary | ICD-10-CM | POA: Diagnosis not present

## 2023-04-04 DIAGNOSIS — N3946 Mixed incontinence: Secondary | ICD-10-CM | POA: Diagnosis not present

## 2023-05-15 DIAGNOSIS — Z299 Encounter for prophylactic measures, unspecified: Secondary | ICD-10-CM | POA: Diagnosis not present

## 2023-05-15 DIAGNOSIS — R5383 Other fatigue: Secondary | ICD-10-CM | POA: Diagnosis not present

## 2023-05-15 DIAGNOSIS — J069 Acute upper respiratory infection, unspecified: Secondary | ICD-10-CM | POA: Diagnosis not present

## 2023-05-15 DIAGNOSIS — R059 Cough, unspecified: Secondary | ICD-10-CM | POA: Diagnosis not present

## 2023-05-28 DIAGNOSIS — Z299 Encounter for prophylactic measures, unspecified: Secondary | ICD-10-CM | POA: Diagnosis not present

## 2023-05-28 DIAGNOSIS — R0981 Nasal congestion: Secondary | ICD-10-CM | POA: Diagnosis not present

## 2023-05-28 DIAGNOSIS — J069 Acute upper respiratory infection, unspecified: Secondary | ICD-10-CM | POA: Diagnosis not present

## 2023-06-20 DIAGNOSIS — H524 Presbyopia: Secondary | ICD-10-CM | POA: Diagnosis not present

## 2023-08-17 DIAGNOSIS — R5383 Other fatigue: Secondary | ICD-10-CM | POA: Diagnosis not present

## 2023-08-17 DIAGNOSIS — D692 Other nonthrombocytopenic purpura: Secondary | ICD-10-CM | POA: Diagnosis not present

## 2023-08-17 DIAGNOSIS — Z79899 Other long term (current) drug therapy: Secondary | ICD-10-CM | POA: Diagnosis not present

## 2023-08-17 DIAGNOSIS — Z299 Encounter for prophylactic measures, unspecified: Secondary | ICD-10-CM | POA: Diagnosis not present

## 2023-08-17 DIAGNOSIS — N1831 Chronic kidney disease, stage 3a: Secondary | ICD-10-CM | POA: Diagnosis not present

## 2023-08-17 DIAGNOSIS — I700 Atherosclerosis of aorta: Secondary | ICD-10-CM | POA: Diagnosis not present

## 2023-08-17 DIAGNOSIS — F331 Major depressive disorder, recurrent, moderate: Secondary | ICD-10-CM | POA: Diagnosis not present

## 2023-10-03 DIAGNOSIS — F322 Major depressive disorder, single episode, severe without psychotic features: Secondary | ICD-10-CM | POA: Diagnosis not present

## 2023-10-03 DIAGNOSIS — Z0000 Encounter for general adult medical examination without abnormal findings: Secondary | ICD-10-CM | POA: Diagnosis not present

## 2023-10-03 DIAGNOSIS — Z299 Encounter for prophylactic measures, unspecified: Secondary | ICD-10-CM | POA: Diagnosis not present

## 2023-10-03 DIAGNOSIS — Z1339 Encounter for screening examination for other mental health and behavioral disorders: Secondary | ICD-10-CM | POA: Diagnosis not present

## 2023-10-03 DIAGNOSIS — E039 Hypothyroidism, unspecified: Secondary | ICD-10-CM | POA: Diagnosis not present

## 2023-10-03 DIAGNOSIS — Z79899 Other long term (current) drug therapy: Secondary | ICD-10-CM | POA: Diagnosis not present

## 2023-10-03 DIAGNOSIS — Z1331 Encounter for screening for depression: Secondary | ICD-10-CM | POA: Diagnosis not present

## 2023-10-03 DIAGNOSIS — E7800 Pure hypercholesterolemia, unspecified: Secondary | ICD-10-CM | POA: Diagnosis not present

## 2023-10-03 DIAGNOSIS — Z7189 Other specified counseling: Secondary | ICD-10-CM | POA: Diagnosis not present

## 2023-10-03 DIAGNOSIS — R5383 Other fatigue: Secondary | ICD-10-CM | POA: Diagnosis not present

## 2023-11-21 DIAGNOSIS — E7800 Pure hypercholesterolemia, unspecified: Secondary | ICD-10-CM | POA: Diagnosis not present

## 2023-11-21 DIAGNOSIS — M545 Low back pain, unspecified: Secondary | ICD-10-CM | POA: Diagnosis not present

## 2023-11-21 DIAGNOSIS — E039 Hypothyroidism, unspecified: Secondary | ICD-10-CM | POA: Diagnosis not present

## 2023-11-21 DIAGNOSIS — Z0000 Encounter for general adult medical examination without abnormal findings: Secondary | ICD-10-CM | POA: Diagnosis not present

## 2023-11-21 DIAGNOSIS — Z299 Encounter for prophylactic measures, unspecified: Secondary | ICD-10-CM | POA: Diagnosis not present

## 2023-11-21 DIAGNOSIS — F322 Major depressive disorder, single episode, severe without psychotic features: Secondary | ICD-10-CM | POA: Diagnosis not present

## 2023-11-25 DIAGNOSIS — Z1211 Encounter for screening for malignant neoplasm of colon: Secondary | ICD-10-CM | POA: Diagnosis not present

## 2023-11-25 DIAGNOSIS — Z1212 Encounter for screening for malignant neoplasm of rectum: Secondary | ICD-10-CM | POA: Diagnosis not present

## 2024-02-05 DIAGNOSIS — Z299 Encounter for prophylactic measures, unspecified: Secondary | ICD-10-CM | POA: Diagnosis not present

## 2024-02-05 DIAGNOSIS — F331 Major depressive disorder, recurrent, moderate: Secondary | ICD-10-CM | POA: Diagnosis not present

## 2024-02-05 DIAGNOSIS — E7800 Pure hypercholesterolemia, unspecified: Secondary | ICD-10-CM | POA: Diagnosis not present

## 2024-02-05 DIAGNOSIS — F419 Anxiety disorder, unspecified: Secondary | ICD-10-CM | POA: Diagnosis not present
